# Patient Record
Sex: Male | Born: 1990 | Race: White | Hispanic: No | Marital: Single | Smoking: Never smoker
Health system: Southern US, Community
[De-identification: ages and names within clinical notes are randomized; demographics above are authoritative.]

---

## 2011-11-20 ENCOUNTER — Ambulatory Visit (INDEPENDENT_AMBULATORY_CARE_PROVIDER_SITE_OTHER): Payer: Medicare HMO

## 2011-11-20 DIAGNOSIS — Z130 Encounter for screening for diseases of the blood and blood-forming organs and certain disorders involving the immune mechanism: Secondary | ICD-10-CM

## 2012-05-11 ENCOUNTER — Ambulatory Visit (INDEPENDENT_AMBULATORY_CARE_PROVIDER_SITE_OTHER): Payer: Medicare HMO | Admitting: Physician Assistant

## 2012-05-11 VITALS — BP 112/78 | HR 92 | Temp 97.6°F | Resp 16 | Ht 71.25 in | Wt 195.2 lb

## 2012-05-11 DIAGNOSIS — L0591 Pilonidal cyst without abscess: Secondary | ICD-10-CM

## 2012-05-11 DIAGNOSIS — M533 Sacrococcygeal disorders, not elsewhere classified: Secondary | ICD-10-CM

## 2012-05-11 MED ORDER — AMOXICILLIN-POT CLAVULANATE 875-125 MG PO TABS: 1.0000 | ORAL_TABLET | Freq: Two times a day (BID) | ORAL | Status: AC

## 2012-05-11 NOTE — Progress Notes (Signed)
Patient ID: Mark Ruiz MRN: [id number], DOB: April 07, 1991, 21 y.o. Date of Encounter: 05/11/2012, 9:53 AM  Primary Physician: No primary provider on file.  Chief Complaint: Cyst on tailbone  HPI: 21 y.o. year old male with history below presents with one month history of slowly increasing pain along his tailbone. Patient noticed a cyst was developing there. His pain is "not that bad," unless he applies a moderate amount of pressure to the area. The previous day it began to drain some pus. He has never had anything like this before. Has remained afebrile through out. No nausea or vomiting.    No past medical history on file.   Home Meds: Prior to Admission medications   Medication Sig Start Date End Date Taking? Authorizing Provider  amphetamine-dextroamphetamine (ADDERALL XR) 20 MG 24 hr capsule Take 20 mg by mouth every morning. 2 tablet PO 1x daily   Yes Historical Provider, MD  atomoxetine (STRATTERA) 80 MG capsule Take 80 mg by mouth daily.   Yes Historical Provider, MD    Allergies: No Known Allergies  History   Social History  . Marital Status: Single    Spouse Name: N/A    Number of Children: N/A  . Years of Education: N/A   Occupational History  . Not on file.   Social History Main Topics  . Smoking status: Never Smoker   . Smokeless tobacco: Not on file  . Alcohol Use: Not on file  . Drug Use: Not on file  . Sexually Active: Not on file   Other Topics Concern  . Not on file   Social History Narrative  . No narrative on file     Review of Systems: Constitutional: negative for chills, fever, night sweats, weight changes, or fatigue  HEENT: negative for vision changes, or hearing loss Cardiovascular: negative for chest pain or palpitations Respiratory: negative for hemoptysis, wheezing, shortness of breath, or cough Abdominal: negative for abdominal pain, nausea, vomiting, diarrhea, or constipation Dermatological: negative for rash Neurologic: negative  for headache, dizziness, or syncope All other systems reviewed and are otherwise negative with the exception to those above and in the HPI.   Physical Exam: Blood pressure 112/78, pulse 92, temperature 97.6 F (36.4 C), temperature source Oral, resp. rate 16, height 5' 11.25" (1.81 m), weight 195 lb 3.2 oz (88.542 kg), SpO2 99.00%., Body mass index is 27.03 kg/(m^2). General: Well developed, well nourished, in no acute distress. Head: Normocephalic, atraumatic, eyes without discharge, sclera non-icteric, nares are without discharge.  Neck: Supple. No thyromegaly. Full ROM. No lymphadenopathy. Lungs: Clear bilaterally to auscultation without wheezes, rales, or rhonchi. Breathing is unlabored. Heart: RRR with S1 S2. No murmurs, rubs, or gallops appreciated. Msk:  Strength and tone normal for age. Extremities/Skin: Left side of gluteal cleft with small tender pilonidal cyst. Scant erythema locally. Mild induration, with small amount of central fluctuance.  Neuro: Alert and oriented X 3. Moves all extremities spontaneously. Gait is normal. CNII-XII grossly in tact. Psych:  Responds to questions appropriately with a normal affect.     PROCEDURE NOTE: Verbal consent obtained. Betadine prep per usual protocol. Local anesthesia obtained with 1% plain lidocaine 2 cc. 1 cm incision made with 11 blade along lesion.  Culture taken. Small amount of purulence expressed. Lesion explored revealing no loculations. Irrigated with 1% plain lidocaine Packed with small amount of 1/4 inch plain packing splint into Dressed. Wound care instructions including precautions with patient. Patient tolerated the procedure well.      ASSESSMENT  AND PLAN:  21 y.o. year old male with pilonidal cyst s/p I&D today. -I&D per above -Augmentin 875/125 mg 1 po bid #20 no RF -Warm compresses -Daily dressing changes -Recheck 24 hours, secondary to patient being out of town in 48 hours  Signed, Eula Listen,  PA-C 05/11/2012 9:53 AM

## 2012-05-12 ENCOUNTER — Encounter: Payer: Self-pay | Admitting: Physician Assistant

## 2012-05-12 ENCOUNTER — Ambulatory Visit (INDEPENDENT_AMBULATORY_CARE_PROVIDER_SITE_OTHER): Payer: Medicare HMO | Admitting: Physician Assistant

## 2012-05-12 VITALS — BP 124/86 | HR 81 | Temp 97.8°F | Resp 18 | Ht 71.0 in | Wt 195.0 lb

## 2012-05-12 DIAGNOSIS — L0591 Pilonidal cyst without abscess: Secondary | ICD-10-CM

## 2012-05-12 NOTE — Progress Notes (Signed)
  Subjective:    Patient ID: Mark Ruiz, male    DOB: 1991/04/26, 21 y.o.   MRN: [id number]  HPI Pt presents for recheck of I&D pilonidal yesterday.  Improved over night.  He has not changed the drsg.  He tolerated the ABX ok.   Review of Systems     Objective:   Physical Exam  The drsg was removed and no purulence was expressed from wound.  The packing was removed and the wound was so small that it was not repacked.  Abx ointment and bandaid was placed on the incision site.      Assessment & Plan:  Pt to continue warm soaks and abx.  He is to apply abx ointment to prevent scab formation and allow any remaining purulent to spontaneously express.  He is to f/u if any problems, if no problems no f/u is needed.

## 2012-05-13 LAB — WOUND CULTURE: Gram Stain: NONE SEEN

## 2014-01-05 ENCOUNTER — Emergency Department (HOSPITAL_COMMUNITY): Payer: Managed Care, Other (non HMO)

## 2014-01-05 ENCOUNTER — Emergency Department (HOSPITAL_COMMUNITY)
Admission: EM | Admit: 2014-01-05 | Discharge: 2014-01-05 | Disposition: A | Discharge status: Home / Self Care | Payer: Managed Care, Other (non HMO) | Attending: Emergency Medicine | Admitting: Emergency Medicine

## 2014-01-05 ENCOUNTER — Encounter (HOSPITAL_COMMUNITY): Payer: Self-pay | Admitting: Emergency Medicine

## 2014-01-05 DIAGNOSIS — W010XXA Fall on same level from slipping, tripping and stumbling without subsequent striking against object, initial encounter: Secondary | ICD-10-CM | POA: Insufficient documentation

## 2014-01-05 DIAGNOSIS — Y939 Activity, unspecified: Secondary | ICD-10-CM | POA: Insufficient documentation

## 2014-01-05 DIAGNOSIS — S52109A Unspecified fracture of upper end of unspecified radius, initial encounter for closed fracture: Secondary | ICD-10-CM | POA: Insufficient documentation

## 2014-01-05 DIAGNOSIS — Y929 Unspecified place or not applicable: Secondary | ICD-10-CM | POA: Insufficient documentation

## 2014-01-05 DIAGNOSIS — S52101A Unspecified fracture of upper end of right radius, initial encounter for closed fracture: Secondary | ICD-10-CM

## 2014-01-05 DIAGNOSIS — Z79899 Other long term (current) drug therapy: Secondary | ICD-10-CM | POA: Insufficient documentation

## 2014-01-05 MED ORDER — HYDROCODONE-ACETAMINOPHEN 5-325 MG PO TABS: 1.0000 | ORAL_TABLET | Freq: Four times a day (QID) | ORAL | Status: AC | PRN

## 2014-01-05 NOTE — ED Notes (Signed)
Bed: WA05 Expected date:  Expected time:  Means of arrival:  Comments: 

## 2014-01-05 NOTE — Discharge Instructions (Signed)
Forearm Fracture °Your caregiver has diagnosed you as having a broken bone (fracture) of the forearm. This is the part of your arm between the elbow and your wrist. Your forearm is made up of two bones. These are the radius and ulna. A fracture is a break in one or both bones. A cast or splint is used to protect and keep your injured bone from moving. The cast or splint will be on generally for about 5 to 6 weeks, with individual variations. °HOME CARE INSTRUCTIONS  °· Keep the injured part elevated while sitting or lying down. Keeping the injury above the level of your heart (the center of the chest). This will decrease swelling and pain. °· Apply ice to the injury for 15-20 minutes, 03-04 times per day while awake, for 2 days. Put the ice in a plastic bag and place a thin towel between the bag of ice and your cast or splint. °· If you have a plaster or fiberglass cast: °· Do not try to scratch the skin under the cast using sharp or pointed objects. °· Check the skin around the cast every day. You may put lotion on any red or sore areas. °· Keep your cast dry and clean. °· If you have a plaster splint: °· Wear the splint as directed. °· You may loosen the elastic around the splint if your fingers become numb, tingle, or turn cold or blue. °· Do not put pressure on any part of your cast or splint. It may break. Rest your cast only on a pillow the first 24 hours until it is fully hardened. °· Your cast or splint can be protected during bathing with a plastic bag. Do not lower the cast or splint into water. °· Only take over-the-counter or prescription medicines for pain, discomfort, or fever as directed by your caregiver. °SEEK IMMEDIATE MEDICAL CARE IF:  °· Your cast gets damaged or breaks. °· You have more severe pain or swelling than you did before the cast. °· Your skin or nails below the injury turn blue or gray, or feel cold or numb. °· There is a bad smell or new stains and/or pus like (purulent) drainage  coming from under the cast. °MAKE SURE YOU:  °· Understand these instructions. °· Will watch your condition. °· Will get help right away if you are not doing well or get worse. °Document Released: 10/18/2000 Document Revised: 01/13/2012 Document Reviewed: 06/09/2008 °[employer]® Patient Information ©2014 [employer]. ° °

## 2014-01-05 NOTE — ED Notes (Signed)
Patient states that he fell and "caught my fall on my left arm." Patient states that he believes his left arm is "broken, because of how much I was sweating after it happened." No obvious deformity noted Patient appears in NAD

## 2014-01-05 NOTE — ED Provider Notes (Addendum)
CSN: [id number]     Arrival date & time 01/05/14  0551 History   First MD Initiated Contact with Patient 01/05/14 [phone]     Chief Complaint  Patient presents with  . Arm Pain  . Fall     (Consider location/radiation/quality/duration/timing/severity/associated sxs/prior Treatment) Patient is a 23 y.o. male presenting with arm pain and fall. The history is provided by the patient.  Arm Pain This is a new (tripped over a dog and fell on outstretched arm) problem. The current episode started yesterday. The problem occurs constantly. The problem has not changed since onset.Associated symptoms comments: Pain in the upper forearm/elbow.  No numbness, weakness.  Did not hit head or LOC. The symptoms are aggravated by bending. Nothing relieves the symptoms. Treatments tried: NSAID, elevation, Ice. The treatment provided no relief.  Fall    History reviewed. No pertinent past medical history. History reviewed. No pertinent past surgical history. History reviewed. No pertinent family history. History  Substance Use Topics  . Smoking status: Never Smoker   . Smokeless tobacco: Not on file  . Alcohol Use: No    Review of Systems  All other systems reviewed and are negative.      Allergies  Review of patient's allergies indicates no known allergies.  Home Medications   Current Outpatient Rx  Name  Route  Sig  Dispense  Refill  . amphetamine-dextroamphetamine (ADDERALL XR) 20 MG 24 hr capsule   Oral   Take 40 mg by mouth every morning.          Marland Kitchen. atomoxetine (STRATTERA) 80 MG capsule   Oral   Take 80 mg by mouth daily.         Marland Kitchen. ibuprofen (ADVIL,MOTRIN) 200 MG tablet   Oral   Take 600 mg by mouth every 6 (six) hours as needed.         Marland Kitchen. oxymetazoline (AFRIN) 0.05 % nasal spray   Each Nare   Place 1 spray into both nostrils 2 (two) times daily as needed for congestion.          BP 125/76  Pulse 74  Temp(Src) 98 F (36.7 C) (Oral)  Resp 20  Ht 6' (1.829 m)  Wt  220 lb (99.791 kg)  BMI 29.83 kg/m2  SpO2 96% Physical Exam  Nursing note and vitals reviewed. Constitutional: He is oriented to person, place, and time. He appears well-developed and well-nourished. No distress.  HENT:  Head: Normocephalic and atraumatic.  Cardiovascular: Normal rate.   Pulmonary/Chest: Effort normal.  Musculoskeletal:  Tenderness and swelling over the left proximal radius and medial epicondyle.  Pain with extension of the elbow.  No olecranon or lateral pain.  2+ radial pulse and normal hand grip and sensation.  Left Wrist and shoulder are wnl.  Neurological: He is alert and oriented to person, place, and time. He has normal strength. No sensory deficit.  Skin: Skin is warm and dry.  Psychiatric: He has a normal mood and affect. His behavior is normal.    ED Course  Procedures (including critical care time) Labs Review Labs Reviewed - No data to display Imaging Review Dg Elbow Complete Left  01/05/2014   CLINICAL DATA:  Pain post trauma  EXAM: LEFT ELBOW - COMPLETE 3+ VIEW  COMPARISON:  None.  FINDINGS: Frontal, lateral, and bilateral oblique views were obtained. There is an impacted fracture of the proximal radial metaphysis. Alignment is near anatomic. No other fracture. No dislocation. There is a small joint effusion.  IMPRESSION: Impacted  fracture proximal radial metaphysis.   Electronically Signed   By: Bretta Bang M.D.   On: 01/05/2014 07:39   Dg Forearm Left  01/05/2014   CLINICAL DATA:  Pain post trauma  EXAM: LEFT FOREARM - 2 VIEW  COMPARISON:  None.  FINDINGS: Frontal and lateral views were obtained. There is an impacted fracture of the proximal radial metaphysis in near anatomic alignment. No other fracture. No dislocation. Joint spaces appear intact.  IMPRESSION: Impacted fracture proximal radial metaphysis.   Electronically Signed   By: Bretta Bang M.D.   On: 01/05/2014 07:40     EKG Interpretation None      MDM   Final diagnoses:  Closed  fracture of right proximal radius   Patient presents after a mechanical fall on an outstretched arm last night with persistent left proximal forearm and elbow pain. Patient is neurovascularly intact however some swelling and pain with extension of the elbow. Proximal forearm and elbow films pending.  7:48 AM Plain films show an impacted proximal radial fracture. Patient placed in a long-arm splint and given followup with orthopedics  Gwyneth Sprout, MD 01/05/14 [id number]  Gwyneth Sprout, MD 01/05/14 1191

## 2015-06-01 IMAGING — CR DG FOREARM 2V*L*
2 series · 2 of 2 positions shown · non-contrast
Comparison: None.

CLINICAL DATA: Pain post trauma

EXAM:
LEFT FOREARM - 2 VIEW

[x forearm ap left]
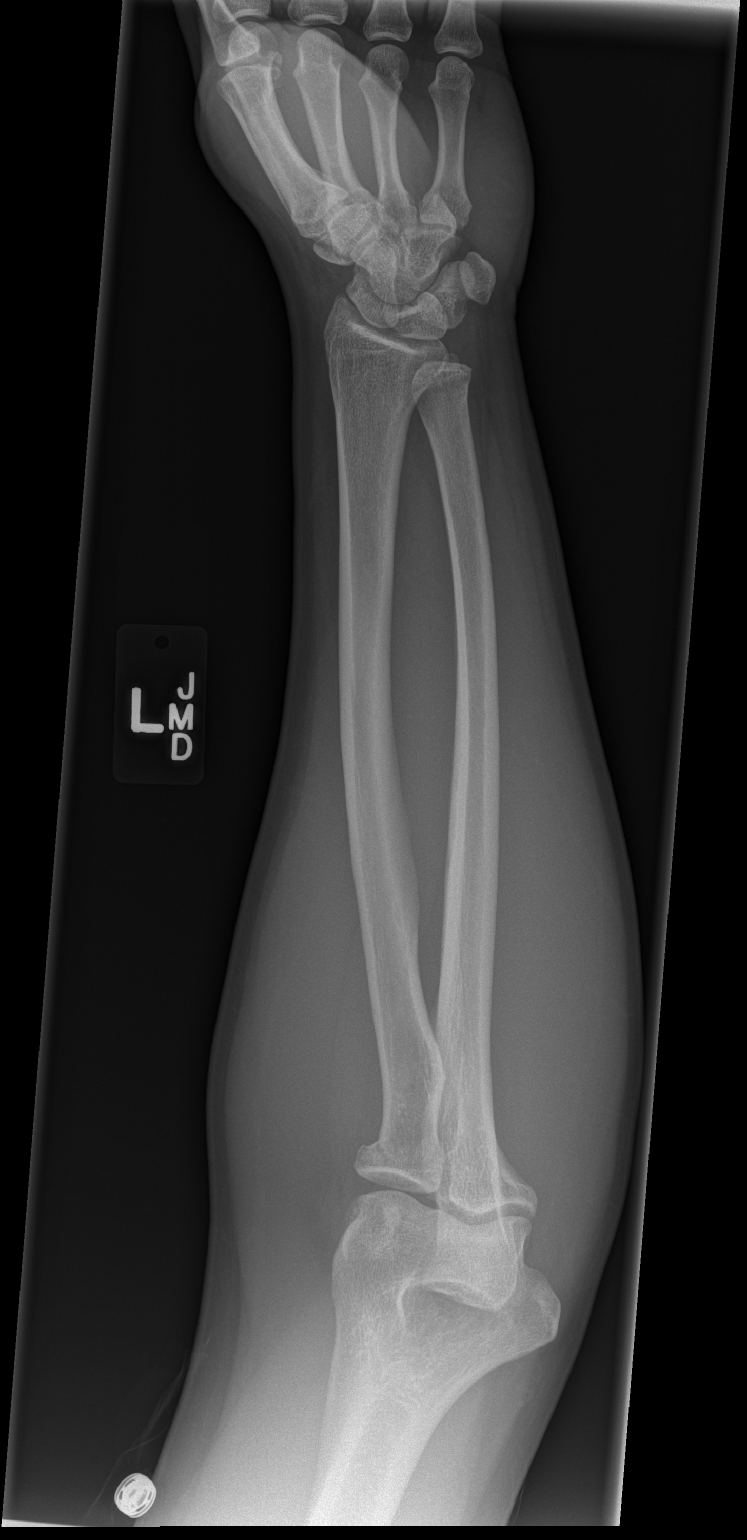

[x forearm lat left]
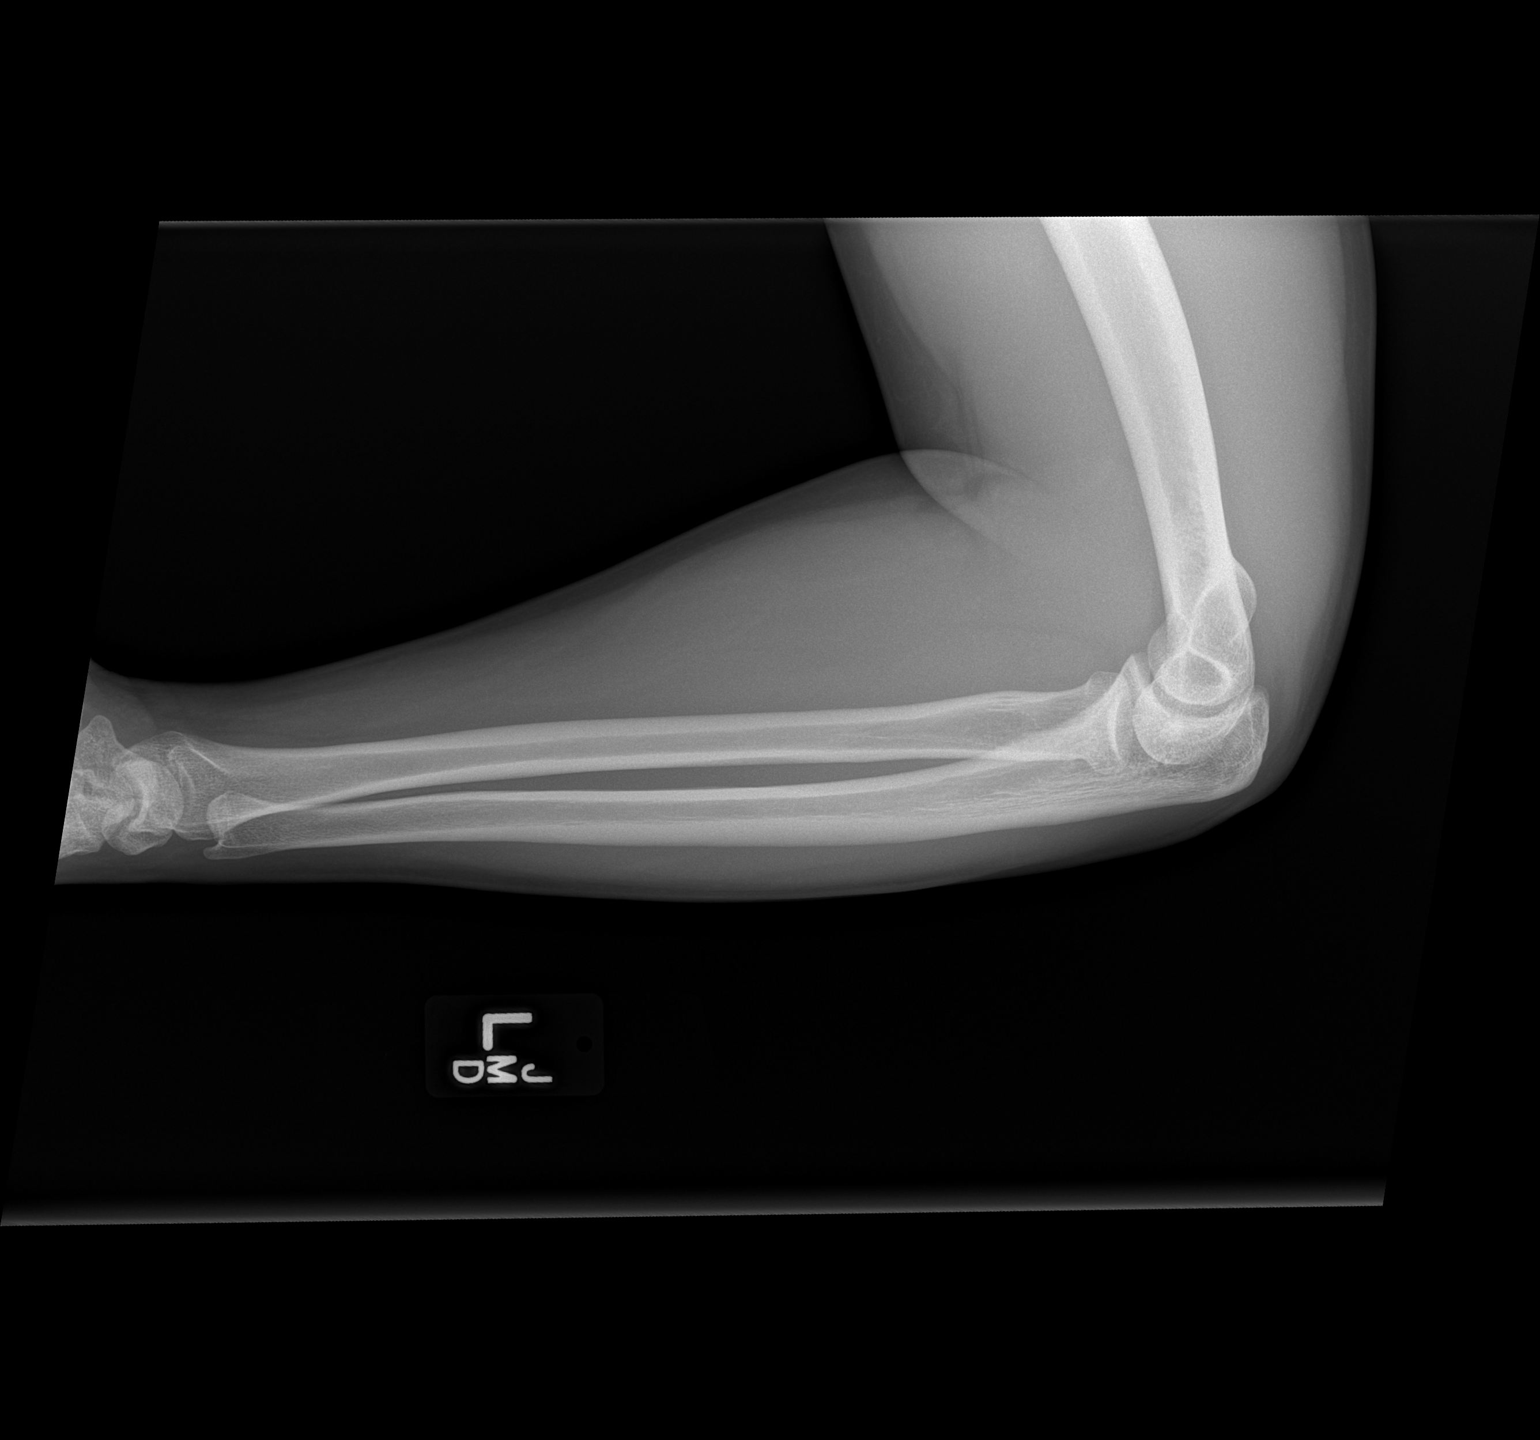

[2 of 2 positions shown; findings below may reference images not displayed]

FINDINGS: Frontal and lateral views were obtained. There is an impacted
fracture of the proximal radial metaphysis in near anatomic
alignment. No other fracture. No dislocation. Joint spaces appear
intact.
IMPRESSION: Impacted fracture proximal radial metaphysis.

## 2015-06-01 IMAGING — CR DG ELBOW COMPLETE 3+V*L*
4 series · 4 of 4 positions shown · non-contrast
Comparison: None.

CLINICAL DATA: Pain post trauma

EXAM:
LEFT ELBOW - COMPLETE 3+ VIEW

[x elbow lat left]
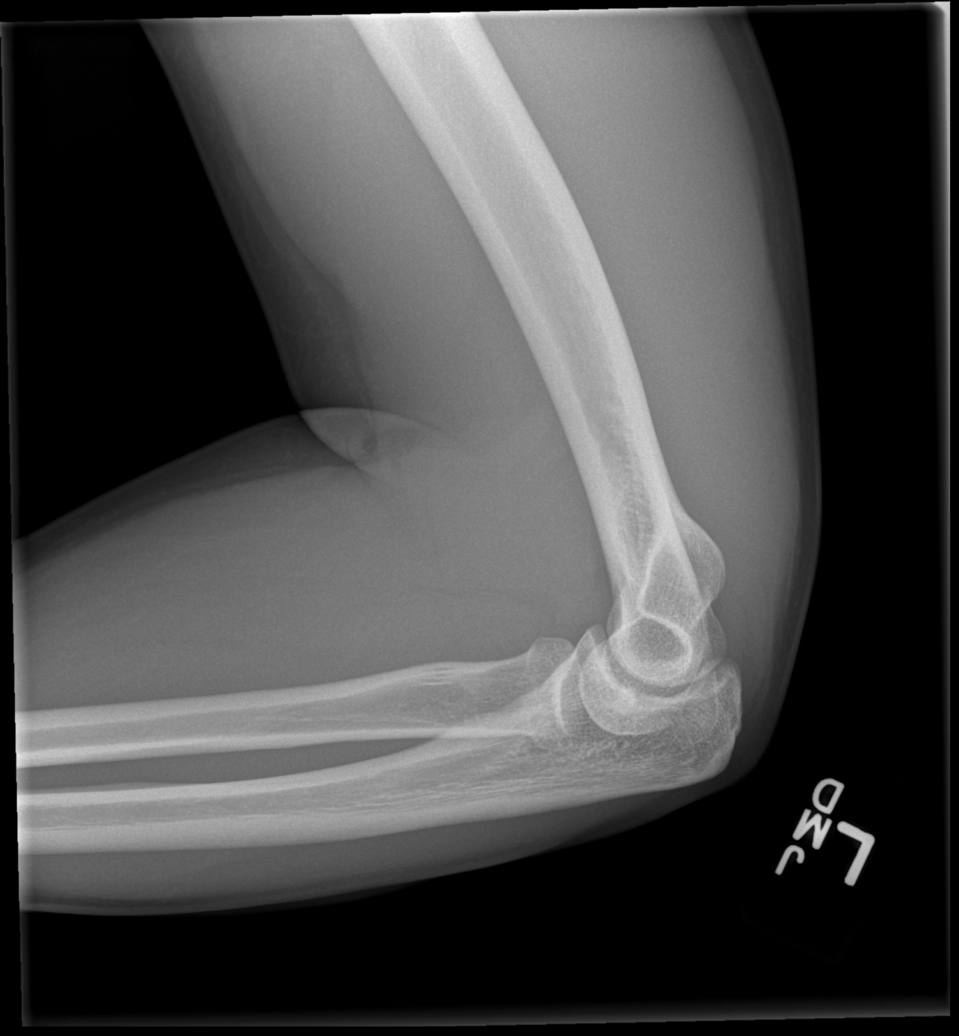

[x elbow ap left]
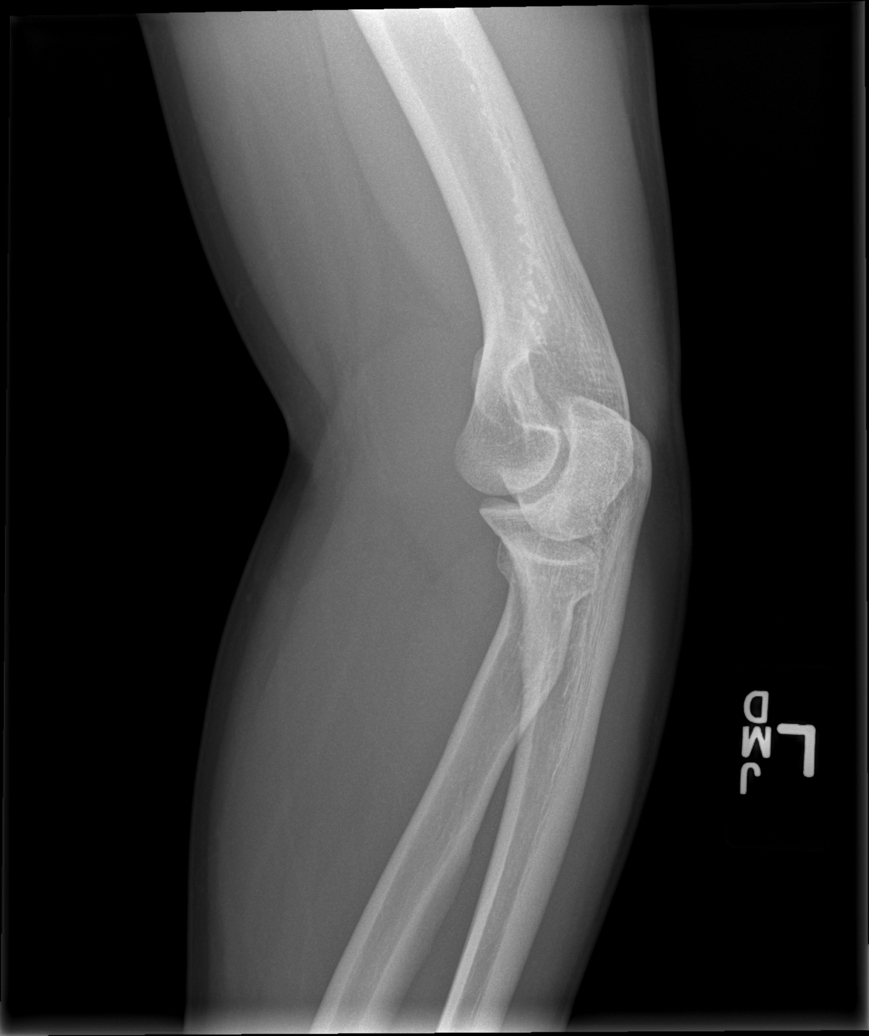

[x elbow obl left (1 of 2)]
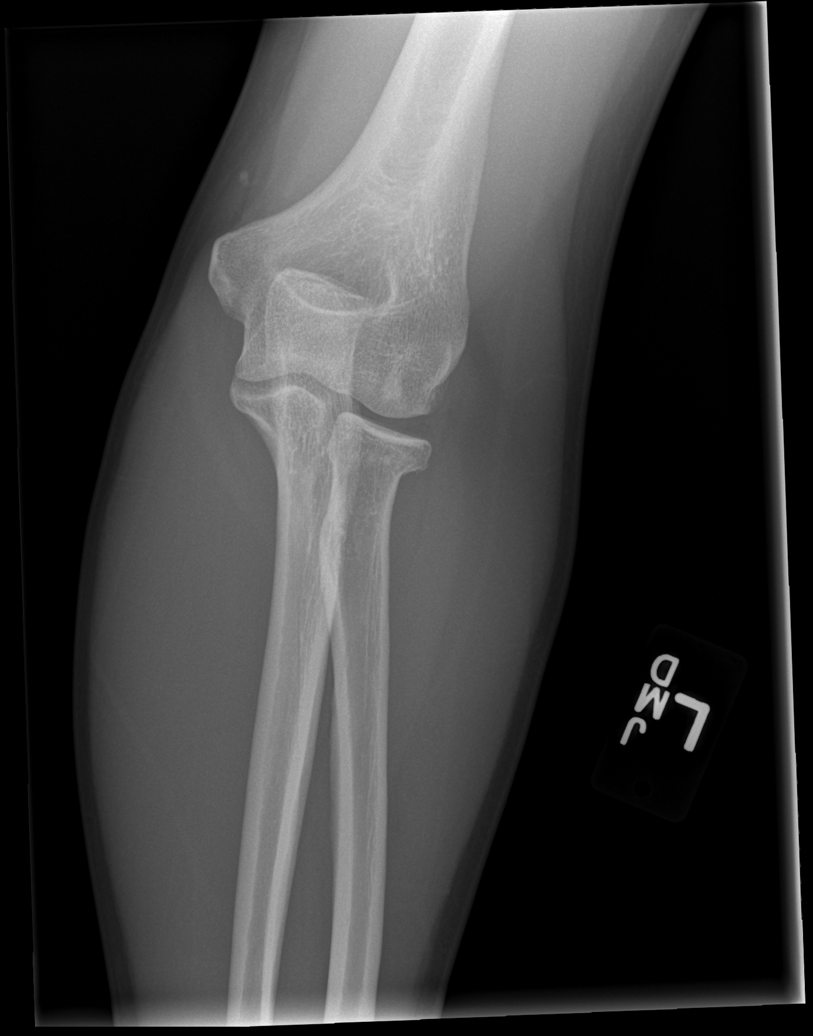

[x elbow obl left (2 of 2)]
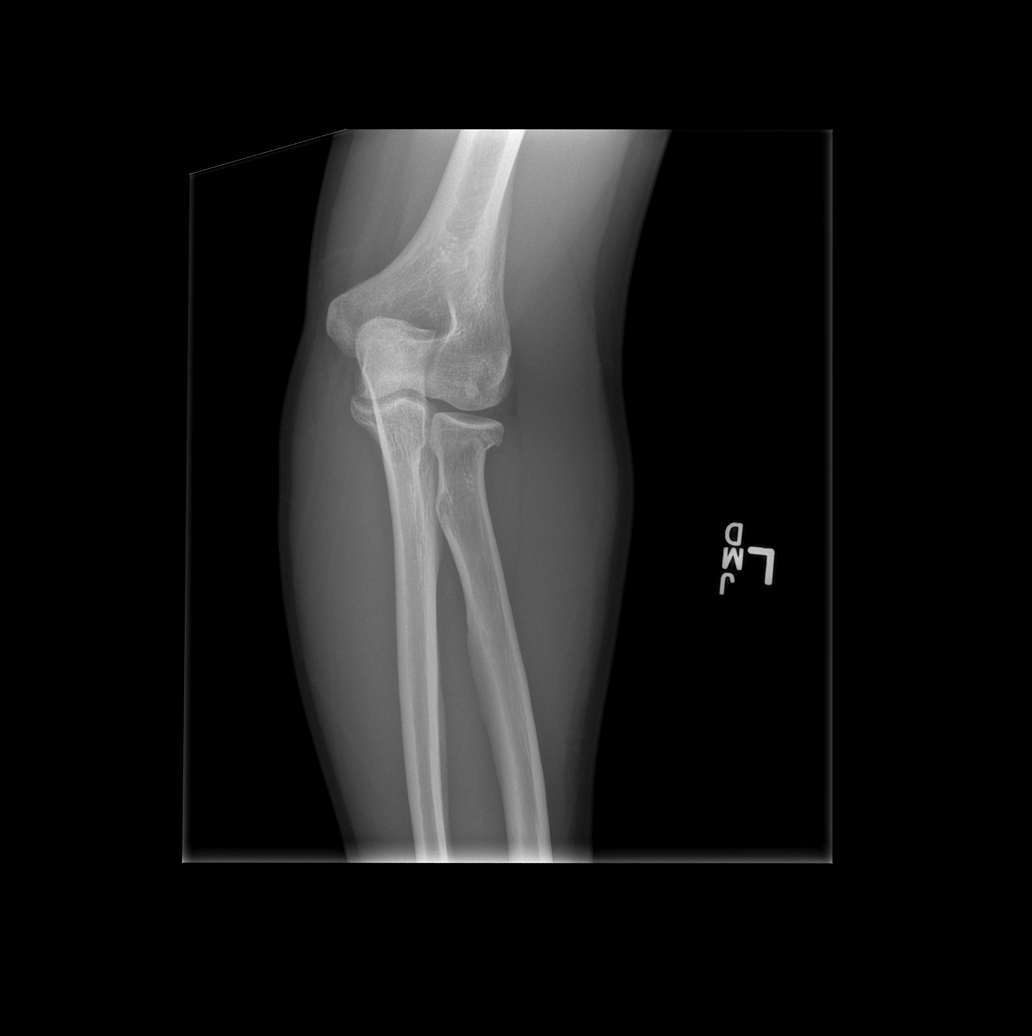

[4 of 4 positions shown; findings below may reference images not displayed]

FINDINGS: Frontal, lateral, and bilateral oblique views were obtained. There
is an impacted fracture of the proximal radial metaphysis. Alignment
is near anatomic. No other fracture. No dislocation. There is a
small joint effusion.
IMPRESSION: Impacted fracture proximal radial metaphysis.

## 2022-07-05 ENCOUNTER — Encounter: Admit: 2022-07-05 | Discharge: 2022-07-05 | Payer: No Typology Code available for payment source

## 2022-07-17 ENCOUNTER — Encounter: Admit: 2022-07-17 | Discharge: 2022-07-17 | Payer: No Typology Code available for payment source

## 2022-07-17 ENCOUNTER — Ambulatory Visit: Admit: 2022-07-17 | Discharge: 2022-07-18 | Payer: 59

## 2022-07-17 DIAGNOSIS — Z114 Encounter for screening for human immunodeficiency virus [HIV]: Secondary | ICD-10-CM

## 2022-07-17 DIAGNOSIS — F419 Anxiety disorder, unspecified: Secondary | ICD-10-CM

## 2022-07-17 DIAGNOSIS — F5104 Psychophysiologic insomnia: Secondary | ICD-10-CM

## 2022-07-17 DIAGNOSIS — T7840XA Allergy, unspecified, initial encounter: Secondary | ICD-10-CM

## 2022-07-17 DIAGNOSIS — Z0000 Encounter for general adult medical examination without abnormal findings: Secondary | ICD-10-CM

## 2022-07-17 MED ORDER — VENLAFAXINE 37.5 MG PO CP24
ORAL_CAPSULE | ORAL | 0 refills | Status: AC
Start: 2022-07-17 — End: ?

## 2022-07-17 NOTE — Progress Notes
Subjective:       Patient Reported Other  What topic(s) would you like to cover during your appointment?:  Medications  Establishing care  Please describe the issue(s) and history with the issue (location, severity, duration, symptoms, etc.).:  Need for continued care for my ADHD. I have been treated for ADHD since the late 90's.  What has been done so far to take care of the issue(s)?:  Medication, coaching, therapy  What are your goals for this visit?:  Establish care.    Frank Parrish is a 31 y.o. male who presents for comprehensive visit to establish care. He has medical history of ADHD, anxiety and depression (not currently on any medication), OSA, obesity and elevated liver enzymes.     His wife is a transplant hepatology attending at [employer]. They recently moved here from [place].   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   He is scheduled to see a psychiatrist next month outside the [facility] system - Dr. Nolen Mu at [address] In [place], [place]. He reports in the past he has taken medications - Lexapro and he is unsure of the name of the other, but he thinks it may have been an SNRI. if he has taken an SNRI. He reports they have caused him difficulties with sleep.   He awakens in the middle of the night and feels tense. He is interested in starting a medication to help with mood prior to establishing with psychiatry. We reviewed adverse effects of SSRIs and SNRIs and he is agreeable to trial of venlafaxine.   ADHD- Adderall 30 mg ER in the AM with the dextrostat 10 mg x BID - This was previously managed by his psychiatrist, Dr. Briscoe Burns. Previously was on higher doses of ADHD medications. He plans to have his psychiatrist take over filling these medications.       Gabapentin 300 mg qHS initially started for nerve pain - taking it now more for sleep. Uses clonidine for sleep as well. He is interested in referral to [facility] to discuss CBT for insomnia.   Reports he had a sleep study in 2019 when he was 30 lbs heavier. Diagnosed with OSA; he hasn't been able to tolerate a CPAP mask. He would like referral for sleep evaluation.    He used Noom to lose weight - has been using it for 2 years. He focuses on mindful eating. He does Crossfit and running for exercise. He runs 3 to 6 miles two or three times per week.     He is a nonsmoker.  Stopped drinking all alcohol about 4 years ago.   No concern for STIs.        Review of Systems   Psychiatric/Behavioral: Positive for dysphoric mood and sleep disturbance. Negative for suicidal ideas. The patient is nervous/anxious.        Medical History:   Diagnosis Date   ? Allergy 1992    seasonal allergies   ? Anxiety disorder 2017     Surgical History:   Procedure Laterality Date   ? ANKLE SURGERY Left     2018   ? HX TONSILLECTOMY  2012   ? SPINE SURGERY  2013    microdiscectomy     Family History   Problem Relation Age of Onset   ? Mental Illness Mother         ADHD, Anxiety   ? Hypertension Father    ? Mental  Illness Father         ADHD, Anxiety   ? Mental Illness Sister         ADHD, Anxiety, Depression   ? Mental Illness Paternal Uncle         ADHD   ? Mental Illness Paternal Aunt         ADHD     Social History     Socioeconomic History   ? Marital status: Married   Tobacco Use   ? Smoking status: Never   ? Smokeless tobacco: Never   Substance and Sexual Activity   ? Alcohol use: Never   ? Drug use: Never   ? Sexual activity: Yes     Partners: Female     Birth control/protection: Pill                   Depression Screening:  Patient Scores:  : PHQ-2 Score: 4 (07/17/2022 11:18 AM)    Patient Health Questionnaire (PHQ)-9: PHQ-9 Score: 15 (07/17/2022 11:18 AM)    Interventions:  Patient Health Questionnaire (PHQ)-2: No data recorded  Depression Interventions Patient Health Questionnaire (PHQ)-2/9: Interventions: -- (Prescribed venlafaxine; will establish with Psychiatry in 1 month) (07/17/2022 11:18 AM)          Objective:         ? amphetamine-dextroamphetamine XR (ADDERALL XR) 30 mg capsule Take one capsule by mouth every morning.   ? cetirizine (ZYRTEC) 10 mg tablet Take one tablet by mouth daily.   ? cloNIDine HCL (CATAPRES) 0.3 mg tablet Take one tablet by mouth daily.   ? dextroamphetamine sulfate (DEXTROSTAT) 10 mg tablet Take one tablet by mouth twice daily.   ? gabapentin (NEURONTIN) 300 mg capsule Take three capsules by mouth at bedtime daily.   ? venlafaxine XR (EFFEXOR XR) 37.5 mg capsule Take one capsule by mouth daily for 14 days, THEN two capsules daily for 42 days. Indications: anxiousness associated with depression     Vitals:    07/17/22 1056   BP: 131/83   Pulse: 68   Resp: 16   SpO2: 98%   PainSc: Zero   Weight: 89.9 kg (198 lb 4.8 oz)   Height: 182.9 cm (6')     Body mass index is 26.89 kg/m?Marland Kitchen     Reviewed his labs from 10/2021:   HbA1c: 5.4%  CBC unremarkable.   Lipid profile notable for LDL 128, HDL 49, triglycerides 80; improved from prior in 09/2018  CMP wnl - AST: 26, ALT 39, Alk phos: 64. Prior abnormal liver enzymes.    Physical Exam  Vitals reviewed.   Constitutional:       General: He is not in acute distress.     Appearance: Normal appearance. He is not ill-appearing.   HENT:      Head: Normocephalic and atraumatic.      Right Ear: Tympanic membrane, ear canal and external ear normal.      Left Ear: Tympanic membrane, ear canal and external ear normal.   Eyes:      Extraocular Movements: Extraocular movements intact.   Cardiovascular:      Rate and Rhythm: Normal rate and regular rhythm.      Heart sounds: Normal heart sounds. No murmur heard.  Pulmonary:      Effort: Pulmonary effort is normal. No respiratory distress.      Breath sounds: Normal breath sounds.   Abdominal:      Palpations: Abdomen is soft.      Tenderness: There  is no abdominal tenderness.   Musculoskeletal:      Cervical back: No tenderness.      Right lower leg: No edema.      Left lower leg: No edema. Lymphadenopathy:      Cervical: No cervical adenopathy.   Skin:     General: Skin is warm and dry.   Neurological:      General: No focal deficit present.      Mental Status: He is alert and oriented to person, place, and time.   Psychiatric:         Mood and Affect: Mood normal.         Behavior: Behavior normal.              Assessment and Plan:  Maury Groninger was seen today for establish care.    Diagnoses and all orders for this visit:    Encounter for screening and preventative care  Discussed age and gender appopriate screening and preventative care. Provided counseling and anticipatory guidance as well as risk factor reduction interventions.   Encuraged ongoing healthy lifestyle    Screening for HIV (human immunodeficiency virus)  -     HIV 1 & 2 AG-AB SCRN W REFLEX TO HIV CONFIRMATION; Future; Expected date: 07/17/2022      Psychophysiological insomnia  - Likely due to multiple factors: medication, anxiety/depressiona and OSA. Will start venlafaxine as below, refer to [facility] for CBT-i and also address OSA.   -     AMB REFERRAL TO [facility]    OSA (obstructive sleep apnea)  Prior diagnosis in 2019 when 30 lbs heavier; continues to focus on weight loss. He has not been able to tolerate CPAP mask.   -     AMB REFERRAL TO PULMONARY    Overweight  Body mass index is 26.89 kg/m?Marland Kitchen     INTRACLINIC REFERRAL TO DIETICIAN    Anxiety and depression  -  Will start venlafaxine 37.5 mg daily x 14 days and then increase to 75 mg and follow up with Psychiatry. Advised if not able to tolerate this dose or has any adverse side effects, let us know otherwise will defer follow up to Psychiatry who he will establish with in 1 month.   -     venlafaxine XR (EFFEXOR XR) 37.5 mg capsule; Take one capsule by mouth daily for 14 days, THEN two capsules daily for 42 days. Indications: anxiousness associated with depression        Above medical problems reviewed and discussed in detail.       Lorrene Reid, MD      Heath Maintenance / Prevention    Health Maintenance   Topic Date Due   ? HIV SCREENING  Never done   ? INFLUENZA VACCINE (1) 08/04/2022   ? PHYSICAL (COMPREHENSIVE) EXAM  07/18/2023   ? DTAP/TDAP VACCINES (2 - Td or Tdap) 02/05/2025   ? COVID-19 VACCINE  Completed   ? DEPRESSION SCREENING  Completed   ? HEPATITIS C SCREENING  Addressed   ? PNEUMOCOCCAL VACCINE 0-64 YRS  Aged Out         Immunization History   Administered Date(s) Administered   ? COVID-19 (MODERNA BOOSTER), mRNA vacc, 50 mcg/0.25 mL (PF) 09/25/2020   ? COVID-19 (MODERNA), mRNA vacc, 100 mcg/0.5 mL (PF) 02/02/2020, 03/01/2020   ? COVID-19 Bivalent (5YR+)(PFIZER), mRNA vacc, 41mcg/0.3mL 08/20/2021   ? Flu Vaccine Quadrivalent =>3 Yo (Preservative Free) 11/13/2014, 09/10/2018, 09/17/2019, 09/25/2020, 09/07/2021   ?  Flu Vaccine Trivalent =>3 YO 11/07/2014   ? Flu Vaccine Trivalent =>3 Yo (Preservative Free) 11/13/2012   ? Tdap Vaccine 02/06/2015          Orders Placed This Encounter   ? HIV 1 & 2 AG-AB SCRN W REFLEX TO HIV CONFIRMATION   ? [facility]   ? AMB REFERRAL TO PULMONARY   ? INTRACLINIC REFERRAL TO DIETICIAN   ? venlafaxine XR (EFFEXOR XR) 37.5 mg capsule       Patient Instructions     It was great seeing you today!     Orders Placed This Encounter   ? HIV 1 & 2 AG-AB SCRN W REFLEX TO HIV CONFIRMATION   ? [facility]   ? AMB REFERRAL TO PULMONARY   ? INTRACLINIC REFERRAL TO DIETICIAN   ? venlafaxine XR (EFFEXOR XR) 37.5 mg capsule         I will plan to see you next in 12 months.      How to reach Dr. Malachi Carl:  Please send a MyChart message to the General Medicine clinic or call Dr. Aggie Cosier nurse, Lyla Son, at [phone].  When calling please leave your name (including spelling), date of birth, phone number where you can be reached and a description of why you are calling.  Lyla Son will return your call as soon as possible.  If you need to send a fax to our office, our fax number is [phone].     Scheduling:  The General Medicine clinic scheduling line can be contacted at [phone] option 1.  Same day appointments are usually available for urgent care needs (note, this will be with the first available physician or nurse practitioner if Dr. Malachi Carl has no openings).     Lab Results:  Due to the CARES act, as of April 1st, 2021 lab results automatically release to MyChart.  Dr. Malachi Carl will send you a result note on any labs that she orders.       Please be aware of the [facility] and [facility]ics that may be good options for care when our clinic is not open to you.  Information included below.     [facility]  Located at the [facility]  [address]  [address]  [place], [place] [address]  Phone:  [phone]  [email or web address]  Open:  8:00 AM - 7:00 PM (CDT) Monday-Friday     [facility] [place] (Urgent Care centers are open every holiday except Christmas Day)     [facility]  [address]  Open 7:00 AM - 9:00 PM (CDT)  [phone]     [facility]  [address]  [phone]  Weekdays 9AM-9PM  Weekends 8AM-4PM     [facility]  [facility]  [address]  [phone]  Weekdays 8AM-6:30PM  Saturdays 10AM-4PM     [facility]  [address]  [phone]  Weekdays 5PM-9PM  Weekends 8AM- 1PM

## 2022-07-17 NOTE — Patient Instructions
It was great seeing you today!     Orders Placed This Encounter    HIV 1 & 2 AG-AB SCRN W REFLEX TO HIV CONFIRMATION    [facility]    AMB REFERRAL TO PULMONARY    INTRACLINIC REFERRAL TO DIETICIAN    venlafaxine XR (EFFEXOR XR) 37.5 mg capsule         I will plan to see you next in 12 months.      How to reach Dr. Malachi Carl:  Please send a MyChart message to the General Medicine clinic or call Dr. Aggie Cosier nurse, Lyla Son, at [phone].  When calling please leave your name (including spelling), date of birth, phone number where you can be reached and a description of why you are calling.  Lyla Son will return your call as soon as possible.  If you need to send a fax to our office, our fax number is [phone].     Scheduling:  The General Medicine clinic scheduling line can be contacted at [phone] option 1.  Same day appointments are usually available for urgent care needs (note, this will be with the first available physician or nurse practitioner if Dr. Malachi Carl has no openings).     Lab Results:  Due to the CARES act, as of April 1st, 2021 lab results automatically release to MyChart.  Dr. Malachi Carl will send you a result note on any labs that she orders.       Please be aware of the [facility] and [facility] that may be good options for care when our clinic is not open to you.  Information included below.     [facility]  Located at the [facility]  [address]  [address]  [place], [place] [address]  Phone:  [phone]  [email or web address]  Open:  8:00 AM - 7:00 PM (CDT) Monday-Friday      Urgent Care Centers- [place] (Urgent Care centers are open every holiday except Christmas Day)     [facility]  [facility]  [address]  Open 7:00 AM - 9:00 PM (CDT)  [phone]     [facility]  [address]  [phone]  Weekdays 9AM-9PM  Weekends 8AM-4PM     [facility]  [facility]  [address]  [phone]  Weekdays 8AM-6:30PM  Saturdays 10AM-4PM     [facility]  [address]  [phone]  Weekdays 5PM-9PM  Weekends 8AM- 1PM

## 2022-07-18 DIAGNOSIS — G4733 Obstructive sleep apnea (adult) (pediatric): Secondary | ICD-10-CM

## 2022-07-18 DIAGNOSIS — E663 Overweight: Secondary | ICD-10-CM

## 2022-07-23 ENCOUNTER — Encounter: Admit: 2022-07-23 | Discharge: 2022-07-23 | Payer: No Typology Code available for payment source

## 2022-08-02 ENCOUNTER — Encounter: Admit: 2022-08-02 | Discharge: 2022-08-02 | Payer: 59

## 2022-08-02 MED ORDER — CLONIDINE HCL 0.3 MG PO TAB
.3 mg | ORAL_TABLET | Freq: Every day | ORAL | 3 refills | Status: AC
Start: 2022-08-02 — End: ?

## 2022-08-02 MED ORDER — DEXTROAMPHETAMINE SULFATE 10 MG PO TAB
10 mg | ORAL_TABLET | Freq: Two times a day (BID) | ORAL | 0 refills | Status: CN
Start: 2022-08-02 — End: ?

## 2022-08-02 MED ORDER — DEXTROAMPHETAMINE SULFATE 10 MG PO TAB
10 mg | ORAL_TABLET | Freq: Two times a day (BID) | ORAL | 0 refills | Status: AC
Start: 2022-08-02 — End: ?

## 2022-08-02 MED ORDER — DEXTROAMPHETAMINE-AMPHETAMINE 30 MG PO CP24
30 mg | ORAL_CAPSULE | Freq: Every morning | ORAL | 0 refills | Status: CN
Start: 2022-08-02 — End: ?

## 2022-08-02 MED ORDER — DEXTROAMPHETAMINE-AMPHETAMINE 30 MG PO CP24
30 mg | ORAL_CAPSULE | Freq: Every morning | ORAL | 0 refills | Status: AC
Start: 2022-08-02 — End: ?

## 2022-08-02 NOTE — Telephone Encounter
Requested Prescriptions   Pending Prescriptions Disp Refills   ? cloNIDine HCL (CATAPRES) 0.3 mg tablet 90 tablet 3     Sig: Take one tablet by mouth daily.       Cardiovascular: Alpha-2 Agonists - Clonidine & Guanfacine Passed - 08/02/2022  1:06 PM        Passed - Valid encounter within last 6 months     Recent Visits  Date Type Provider Dept   07/17/22 Office Visit Altenhofen, Rosina Lowenstein, MD [facility]   Showing recent visits within past 720 days and meeting all other requirements  Future Appointments  Date Type Provider Dept   09/05/22 Appointment [facility]   09/19/22 Appointment [facility]   10/03/22 Appointment [facility]   Showing future appointments within next 360 days and meeting all other requirements            Passed - BP completed in the last 6 months     BP Readings from Last 2 Encounters:   07/17/22 131/83             Passed - Heart Rate completed in the last 6 months     Pulse Readings from Last 2 Encounters:   07/17/22 68              ? amphetamine-dextroamphetamine XR (ADDERALL XR) 30 mg capsule 30 capsule 0     Sig: Take one capsule by mouth every morning.       Psychiatry: Stimulants/ADHD Failed - 08/02/2022  1:06 PM        Failed - This refill cannot be delegated        Failed - Manual Review: Consider an annual Urine Drug Screen and Controlled Substance Agreement        Passed - Valid encounter within last 3 months     Recent Visits  Date Type Provider Dept   07/17/22 Office Visit Altenhofen, Rosina Lowenstein, MD [facility]   Showing recent visits within past 720 days and meeting all other requirements  Future Appointments  Date Type Provider Dept   09/05/22 Appointment [facility]   09/19/22 Appointment [facility]   10/03/22 Appointment [facility]   Showing future appointments within next 360 days and meeting all other requirements             ? dextroamphetamine sulfate (DEXTROSTAT) 10 mg tablet 180 tablet 0     Sig: Take one tablet by mouth twice daily.       Psychiatry: Stimulants/ADHD Failed - 08/02/2022  1:06 PM        Failed - This refill cannot be delegated        Failed - Manual Review: Consider an annual Urine Drug Screen and Controlled Substance Agreement        Passed - Valid encounter within last 3 months     Recent Visits  Date Type Provider Dept   07/17/22 Office Visit Altenhofen, Rosina Lowenstein, MD [facility]   Showing recent visits within past 720 days and meeting all other requirements  Future Appointments  Date Type Provider Dept   09/05/22 Appointment [facility]   09/19/22 Appointment [facility]   10/03/22 Appointment [facility]   Showing future appointments within next 360 days and meeting all other requirements

## 2022-08-12 ENCOUNTER — Ambulatory Visit: Admit: 2022-08-12 | Discharge: 2022-08-12 | Payer: 59

## 2022-08-12 ENCOUNTER — Encounter: Admit: 2022-08-12 | Discharge: 2022-08-12 | Payer: 59

## 2022-08-12 DIAGNOSIS — H9209 Otalgia, unspecified ear: Secondary | ICD-10-CM

## 2022-08-12 NOTE — Progress Notes
Patient concerned for possible ear infection. Referred in person urgent care for internal ear exam which is unavailable via telehealth. Verbalized understanding, in NAD

## 2022-08-13 ENCOUNTER — Encounter: Admit: 2022-08-13 | Discharge: 2022-08-13 | Payer: 59

## 2022-09-05 ENCOUNTER — Encounter: Admit: 2022-09-05 | Discharge: 2022-09-05 | Payer: 59

## 2022-09-19 ENCOUNTER — Encounter: Admit: 2022-09-19 | Discharge: 2022-09-19 | Payer: 59

## 2022-10-03 ENCOUNTER — Encounter: Admit: 2022-10-03 | Discharge: 2022-10-03 | Payer: 59

## 2022-10-11 ENCOUNTER — Encounter: Admit: 2022-10-11 | Discharge: 2022-10-11 | Payer: 59

## 2022-10-23 ENCOUNTER — Encounter: Admit: 2022-10-23 | Discharge: 2022-10-23 | Payer: 59

## 2022-10-31 ENCOUNTER — Encounter: Admit: 2022-10-31 | Discharge: 2022-10-31 | Payer: 59

## 2022-11-01 ENCOUNTER — Encounter: Admit: 2022-11-01 | Discharge: 2022-11-01 | Payer: 59

## 2022-11-01 ENCOUNTER — Ambulatory Visit: Admit: 2022-11-01 | Discharge: 2022-11-01 | Payer: 59

## 2022-11-01 DIAGNOSIS — T7840XA Allergy, unspecified, initial encounter: Secondary | ICD-10-CM

## 2022-11-01 DIAGNOSIS — F419 Anxiety disorder, unspecified: Secondary | ICD-10-CM

## 2022-11-01 DIAGNOSIS — M25572 Pain in left ankle and joints of left foot: Secondary | ICD-10-CM

## 2022-11-11 ENCOUNTER — Encounter: Admit: 2022-11-11 | Discharge: 2022-11-11 | Payer: 59

## 2022-11-11 ENCOUNTER — Ambulatory Visit: Admit: 2022-11-11 | Discharge: 2022-11-12 | Payer: 59

## 2022-11-11 DIAGNOSIS — Z8669 Personal history of other diseases of the nervous system and sense organs: Secondary | ICD-10-CM

## 2022-11-11 DIAGNOSIS — R634 Abnormal weight loss: Secondary | ICD-10-CM

## 2022-11-11 DIAGNOSIS — Z789 Other specified health status: Secondary | ICD-10-CM

## 2022-11-11 DIAGNOSIS — F419 Anxiety disorder, unspecified: Secondary | ICD-10-CM

## 2022-11-11 DIAGNOSIS — T7840XA Allergy, unspecified, initial encounter: Secondary | ICD-10-CM

## 2022-11-11 NOTE — Telephone Encounter
An encounter has been created for documentation only (often for preparation of an upcoming appointment or for follow up on orders/imaging or records received) and patient does not need contact RN and did not miss a phone call or appointment.     Pre-visit planning for upcoming appointment with Dr.Luthra    New Pt    Referred By: Otilio Saber, MD   [facility]        -DX:OSA (obstructive sleep apnea)     -Last SS: Reports he had a sleep study in 2019 when he was 30 lbs heavier. Diagnosed with OSA; he hasn't been able to tolerate a CPAP mask. He would like referral for sleep evaluation.     -No records found in [place] or [facility].

## 2022-11-12 DIAGNOSIS — G4733 Obstructive sleep apnea (adult) (pediatric): Secondary | ICD-10-CM

## 2022-12-20 ENCOUNTER — Encounter: Admit: 2022-12-20 | Discharge: 2022-12-20 | Payer: 59

## 2022-12-20 DIAGNOSIS — Z789 Other specified health status: Secondary | ICD-10-CM

## 2022-12-20 DIAGNOSIS — R634 Abnormal weight loss: Secondary | ICD-10-CM

## 2022-12-20 DIAGNOSIS — Z8669 Personal history of other diseases of the nervous system and sense organs: Secondary | ICD-10-CM

## 2022-12-21 ENCOUNTER — Ambulatory Visit: Admit: 2022-12-21 | Discharge: 2022-12-20 | Payer: 59

## 2022-12-25 ENCOUNTER — Encounter: Admit: 2022-12-25 | Discharge: 2022-12-25 | Payer: 59

## 2022-12-25 DIAGNOSIS — R0683 Snoring: Secondary | ICD-10-CM

## 2022-12-26 ENCOUNTER — Encounter: Admit: 2022-12-26 | Discharge: 2022-12-26 | Payer: 59

## 2022-12-26 NOTE — Telephone Encounter
Pt requesting most recent SS results and recommendations.  Routed to provider to advise.

## 2023-01-14 ENCOUNTER — Encounter: Admit: 2023-01-14 | Discharge: 2023-01-14 | Payer: 59

## 2023-01-16 ENCOUNTER — Encounter: Admit: 2023-01-16 | Discharge: 2023-01-16 | Payer: 59

## 2023-01-16 ENCOUNTER — Ambulatory Visit: Admit: 2023-01-16 | Discharge: 2023-01-17 | Payer: 59

## 2023-01-16 DIAGNOSIS — F5104 Psychophysiologic insomnia: Secondary | ICD-10-CM

## 2023-01-16 DIAGNOSIS — F419 Anxiety disorder, unspecified: Secondary | ICD-10-CM

## 2023-01-16 DIAGNOSIS — T7840XA Allergy, unspecified, initial encounter: Secondary | ICD-10-CM

## 2023-01-16 DIAGNOSIS — Z8669 Personal history of other diseases of the nervous system and sense organs: Secondary | ICD-10-CM

## 2023-01-16 MED ORDER — DOXEPIN 10 MG PO CAP
10 mg | ORAL_CAPSULE | Freq: Every evening | ORAL | 0 refills | Status: AC
Start: 2023-01-16 — End: ?

## 2023-01-16 NOTE — Progress Notes
Date of Service: 01/16/2023    Telemedicine encounter was conducted using epic integrated Zoom telehealth interface, patient's consent for telemedicine appointment was obtained, patient was seen and evaluated on the above mentioned audio-video platform.     Altenhofen, Rosina Lowenstein, MD  [address] [facility] 4th floor  [place],  [place] [address]    Dear Dr. Malachi Carl,            Frank Parrish is a very pleasant 32 y.o. male presenting to [facility] Sleep Medicine clinic for ongoing evaluation of Sleep Problem.      Subjective:  History of Present Illness  Sleeping about the same.   History of ADHD, Adderall 30 mg XR in morning, 10 mg IR in morning and occasionally 2:30 pm.   Feeling groggy around 9:30 pm, lying in bed by 10:30 pm, no sleep onset, waking up at night about 2-3 times, bathroom sometimes, melatonin reduced to 5 mg, venlafaxine and clonidine 0.3 mg XR, gabapentin was stopped. Waking up by 6:30 am. No daytime naps.     Feels out of energy around noon and reports that his ESS is likely low secondary to him being on stimulants.     Sleep ROS: snoring, no choking/gasping spells, no dry mouth, no restless legs, no sleep related hallucinations, no dream enactments, no sleep walking/eating, no sleep paralysis.     ESS 01/16/2023:       01/16/2023    12:02 PM 12/18/2022    11:00 AM 11/11/2022    11:27 AM   Epworth Sleepiness Scale   Sitting and reading 2 2 1    Watching TV 2 2 0   Sitting inactive in a public place (e.g. a theater or a meeting) 0 0 0   As a passenger in a car for an hour without a break 0 1 0   Lying down to rest in the afternoon when circumstances permit 2 2 1    Sitting and talking to someone 0 0 0   Sitting quietly after a lunch without alcohol 1 1 0   In a car, while stopped for a few minutes in traffic 0 0 0   Epworth Sleepiness Scale Score 7 8 2            Review of Systems:  Sleep related ROS as above, no other new symptoms reported.     Pre-Visit planning:   FUV     -JY:NWGNFA loss History of sleep apnea     Intolerance of continuous positive airway pressure (CPAP) ventilation     Psychophysiological insomnia     -Last SS: 12/20/22      -LV: 11/11/22 with Dr. Clarene Critchley     -01/14/23- Pt reports 'started taking my antidepressants in September and the issue of waking up through out the night was present before that. I knew my sleep quality was bad but now I am seriously concerned about the impact it is having on me.'                        Medical History:   Diagnosis Date    Allergy 1992    seasonal allergies    Anxiety disorder 2017     Problem List:  2024-01: Intolerance of continuous positive airway pressure (CPAP)   ventilation  2024-01: History of sleep apnea  2023-09: OSA (obstructive sleep apnea)  2023-09: Overweight  2023-09: Psychophysiological insomnia  2021-07: Lumbar spondylosis  2019-11: Concentration deficit  2019-04: Bone cyst  of left tibia  2019-04: Left ankle instability  2019-04: Osteochondral defect  2018-12: Generalized anxiety disorder  2016-05: Knee pain, right  2016-05: Left ankle pain  1997-09: ADHD (attention deficit hyperactivity disorder), combined   type    Surgical History:   Procedure Laterality Date    ANKLE SURGERY Left     2018    HX TONSILLECTOMY  2012    SPINE SURGERY  2013    microdiscectomy     Family History   Problem Relation Age of Onset    Mental Illness Mother         ADHD, Anxiety    Hypertension Father     Mental Illness Father         ADHD, Anxiety    Mental Illness Sister         ADHD, Anxiety, Depression    Mental Illness Paternal Uncle         ADHD    Mental Illness Paternal Aunt         ADHD     Social History     Socioeconomic History    Marital status: Married   Tobacco Use    Smoking status: Never    Smokeless tobacco: Never   Substance and Sexual Activity    Alcohol use: Never    Drug use: Never    Sexual activity: Yes     Partners: Female     Birth control/protection: Pill         Objective:          amphetamine-dextroamphetamine XR (ADDERALL XR) 30 mg capsule Take one capsule by mouth every morning. Indications: attention deficit disorder with hyperactivity    cetirizine (ZYRTEC) 10 mg tablet Take one tablet by mouth daily.    cloNIDine HCL (CATAPRES) 0.3 mg tablet Take one tablet by mouth daily.    dextroamphetamine sulfate (DEXTROSTAT) 10 mg tablet Take one tablet by mouth twice daily.    venlafaxine XR (EFFEXOR XR) 75 mg capsule Take one capsule by mouth at bedtime daily.     There were no vitals filed for this visit.  There is no height or weight on file to calculate BMI.     Physical Exam  Physical exam limited due to telemedicine encounter. Patient is alert and oriented, not in any distress, breathing does not appear labored, making good eye contact, speech is comprehensible, face is symmetrical, moving both upper extremities well, mood appropriate, no agitation, thought content appears to be normal, answering questions appropriately.        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 Currently using clonidine as a sleep aid.  -Discontinue clonidine due to unfavorable side effects.  -Start Doxepin 10mg  at bedtime. Monitor for mood related side effects.  -Discontinue melatonin due to suspicion of advanced phase.  -Implement light box therapy with 10,000 lux light at 7 PM for 15 minutes.  -Adhere to consistent bedtime (no earlier than 10:30 PM) and wake up time.    Mild supine Sleep Apnea: Noted only in supine position.  -Advise non-supine sleep to alleviate snoring and mild sleep apnea.    Follow-up in sleep clinic in four months.    Patient was also counseled on good sleep hygiene, non-supine  sleep, avoidance of alcohol before bedtime, regular exercise as tolerated and weight loss to target body weight. Cautioned on the risks of operating motor vehicle or any heavy machinery when sleepy and advised against drowsy driving.   Ample opportunity was provided for questions and concerns. Patient is aware they can contact our office via phone or mychart for any further queries.        Thank you for the opportunity to participate in the care of this patient. We will continue to follow them in Sleep Medicine clinic.     Sincerely,  Asencion Islam, MD  Sleep Medicine     Assistant Professor   [facility]         Total Time Today was 40 minutes in the following activities: Preparing to see the patient, Obtaining and/or reviewing separately obtained history, Performing a medically appropriate examination and/or evaluation, Counseling and educating the patient/family/caregiver, Documenting clinical information in the electronic or other health record, and communicating results to the patient/family/caregiver and Care coordination.

## 2023-01-16 NOTE — Progress Notes
Obtained patient's verbal consent to treat them and their agreement to [facility] financial policy and NPP via this telehealth visit during the Coronavirus Public Health Emergency

## 2023-01-16 NOTE — Telephone Encounter
An encounter has been created for documentation only (often for preparation of an upcoming appointment or for follow up on orders/imaging or records received) and patient does not need contact RN and did not miss a phone call or appointment.     Pre-visit planning for upcoming appointment with Dr.Luthra    FUV    [id number] loss    History of sleep apnea    Intolerance of continuous positive airway pressure (CPAP) ventilation    Psychophysiological insomnia    -Last SS: 12/20/22     -LV: 11/11/22 with Dr. Noreene Filbert    -01/14/23- Pt reports 'started taking my antidepressants in September and the issue of waking up through out the night was present before that. I knew my sleep quality was bad but now I am seriously concerned about the impact it is having on me.'

## 2023-01-28 ENCOUNTER — Encounter: Admit: 2023-01-28 | Discharge: 2023-01-28 | Payer: 59

## 2023-01-29 ENCOUNTER — Encounter: Admit: 2023-01-29 | Discharge: 2023-01-29 | Payer: 59

## 2023-02-26 ENCOUNTER — Encounter: Admit: 2023-02-26 | Discharge: 2023-02-26 | Payer: 59

## 2023-03-24 ENCOUNTER — Encounter: Admit: 2023-03-24 | Discharge: 2023-03-24 | Payer: 59

## 2023-03-24 MED ORDER — VENLAFAXINE 75 MG PO CP24
75 mg | ORAL_CAPSULE | Freq: Every day | ORAL | 0 refills
Start: 2023-03-24 — End: ?

## 2023-03-24 MED ORDER — VENLAFAXINE 75 MG PO CP24
75 mg | ORAL_CAPSULE | Freq: Every day | ORAL | 3 refills | Status: AC
Start: 2023-03-24 — End: ?

## 2023-03-24 NOTE — Telephone Encounter
Some refill protocol elements NOT Met  Medication name: Effexor XR  Medication Strength: 37.5mg  capsule      Office visit due and Labs due   Valid encounter within last 6 months    LDL in normal range and within 360 days     Next office visit: 07/18/23.    Routed to Provider    Shelly Bombard, RN

## 2023-04-15 ENCOUNTER — Encounter: Admit: 2023-04-15 | Discharge: 2023-04-15 | Payer: 59

## 2023-04-15 DIAGNOSIS — F5104 Psychophysiologic insomnia: Secondary | ICD-10-CM

## 2023-04-15 MED ORDER — DOXEPIN 10 MG PO CAP
10 mg | ORAL_CAPSULE | Freq: Every evening | ORAL | 0 refills | Status: AC
Start: 2023-04-15 — End: ?

## 2023-04-15 NOTE — Telephone Encounter
Request received for refill on patient's Doxepin 10 mg.  Last office visit with Dr. Clarene Critchley on 01/16/23.  Last refilled on 01/16/23 with #90 and 0 refills.  Authorized #90 with 0 refills.     Pt has appointment on 05/19/23 with Dr. Clarene Critchley.

## 2023-04-17 ENCOUNTER — Encounter: Admit: 2023-04-17 | Discharge: 2023-04-17 | Payer: 59

## 2023-04-17 ENCOUNTER — Ambulatory Visit: Admit: 2023-04-17 | Discharge: 2023-04-18 | Payer: 59

## 2023-05-05 ENCOUNTER — Encounter: Admit: 2023-05-05 | Discharge: 2023-05-05 | Payer: 59

## 2023-05-05 DIAGNOSIS — Z8279 Family history of other congenital malformations, deformations and chromosomal abnormalities: Secondary | ICD-10-CM

## 2023-05-05 DIAGNOSIS — T7840XA Allergy, unspecified, initial encounter: Secondary | ICD-10-CM

## 2023-05-05 DIAGNOSIS — F419 Anxiety disorder, unspecified: Secondary | ICD-10-CM

## 2023-05-06 ENCOUNTER — Encounter: Admit: 2023-05-06 | Discharge: 2023-05-06 | Payer: 59

## 2023-05-14 ENCOUNTER — Encounter: Admit: 2023-05-14 | Discharge: 2023-05-14 | Payer: 59

## 2023-05-14 NOTE — Telephone Encounter
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  Frank Parrish is at low risk to be a carrier for the other conditions included in this assessment.    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 AAT is a major inhibitor of neutrophil elastase in the lower respiratory tract.  Decreased levels of AAT leave individuals vulnerable to progressive destruction of alveoli by neutrophil elastase-leading to emphysema.  In addition, AAT deficiency leads to liver disease in some patients.  There are more than thirty known variants of the gene for AAT.  95% of [place] Caucasians are of the MM phenotype with normal AAT production.  Classic AAT deficiency is caused by the ZZ phenotype which has only 12% of normal AAT serum levels.  The only other common phenotype of concern is the SZ type which has only about 35% of normal serum levels.   Many individuals are heterozygous carriers for AAT variants, PiMZ for instance, where AAT function is 60% of the norm.  Most AAT variant carriers (MZ for instance) are asymptomatic although they may have a slightly increased risk for lung disease.  PiZZ individuals have a significant risk for obstructive lung disease (particularly if they are smokers).    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 Commonly the pain involves the joints, abdomen and chest, but can happen in other parts of the body as well. A build-up of protein in the organs (amyloidosis) including the kidneys, can lead to kidney failure. Heterozygous carriers of Familial Mediterranean Fever are usually asymptomatic, but have occasionally been reported to exhibit mild to severe symptoms of the disease. Therefore, we would recommend that the patient make his primary care physician aware of these results if concerns arise.       Frank Parrish's carrier screening was negative for Alpha-1-Antitrypsin deficiency and Familial Mediterranean fever.      A full explanation of these disorders can be found on the laboratory report.  Genetic counseling is available at patient request to review the results of this carrier screening.      The patient was reminded that carrier screening may reduce but not eliminate the risk for the tested conditions.  In addition, this is not an exhaustive test for genetic disorders.  These results were communicated to [facility].

## 2023-05-16 ENCOUNTER — Encounter: Admit: 2023-05-16 | Discharge: 2023-05-16 | Payer: 59

## 2023-05-16 DIAGNOSIS — Z1379 Encounter for other screening for genetic and chromosomal anomalies: Secondary | ICD-10-CM

## 2023-05-19 ENCOUNTER — Ambulatory Visit: Admit: 2023-05-19 | Discharge: 2023-05-20 | Payer: 59

## 2023-05-19 ENCOUNTER — Encounter: Admit: 2023-05-19 | Discharge: 2023-05-19 | Payer: 59

## 2023-05-19 DIAGNOSIS — G4711 Idiopathic hypersomnia with long sleep time: Secondary | ICD-10-CM

## 2023-05-19 DIAGNOSIS — G4739 Other sleep apnea: Secondary | ICD-10-CM

## 2023-05-19 DIAGNOSIS — T7840XA Allergy, unspecified, initial encounter: Secondary | ICD-10-CM

## 2023-05-19 DIAGNOSIS — F419 Anxiety disorder, unspecified: Secondary | ICD-10-CM

## 2023-05-19 DIAGNOSIS — G4719 Other hypersomnia: Secondary | ICD-10-CM

## 2023-05-19 NOTE — Telephone Encounter
An encounter has been created for documentation only (often for preparation of an upcoming appointment or for follow up on orders/imaging or records received) and patient does not need contact RN and did not miss a phone call or appointment.     Pre-visit planning for upcoming appointment with Dr.Luthra    FUV    -MV:HQIONGE of sleep apnea    Psychophysiological insomnia    Chronic insomnia    -Last SS: 12/20/22     -LV: 01/16/23 with Dr. Clarene Critchley  Sleep Maintenance Insomnia: Likely psychophysiologic insomnia and advanced sleep wake phase. Currently using clonidine as a sleep aid.  -Discontinue clonidine due to unfavorable side effects.  -Start Doxepin 10mg  at bedtime. Monitor for mood related side effects.  -Discontinue melatonin due to suspicion of advanced phase.  -Implement light box therapy with 10,000 lux light at 7 PM for 15 minutes.  -Adhere to consistent bedtime (no earlier than 10:30 PM) and wake up time.     Mild supine Sleep Apnea: Noted only in supine position.  -Advise non-supine sleep to alleviate snoring and mild sleep apnea.     Follow-up in sleep clinic in four months.  -Meds: Doxepin 10 mg

## 2023-05-19 NOTE — Progress Notes
Date of Service: 05/19/2023    Lorrene Reid, MD  [address] [facility] 4th floor  [place],  [place] [address]    Dear Dr. Malachi Carl,            Frank Parrish is a very pleasant 32 y.o. male presenting to [facility] Sleep Medicine clinic for ongoing evaluation of Sleep Problem.      Subjective:     The patient, known to have ADHD and generalized anxiety disorder, presents with persistent sleep disturbances despite recent medication adjustments. He reports difficulty falling asleep, often taking up to 45 minutes to do so, and frequent nocturnal awakenings, approximately three times per night. Despite these disruptions, he is able to return to sleep within 15 minutes. The patient's sleep hygiene practices include using a body pillow, practicing mindfulness at bedtime, and using white noise. He also reports using a light box in the evening hours.    The patient's current medication regimen includes doxepin, clonidine, and venlafaxine at bedtime, and Adderall XR 30 mg and Adderall IR 10 mg twice daily for ADHD. He also takes Xyzal for allergies. Despite this regimen, the patient reports feeling generally tired during the day and often naps in the afternoon.    The patient's sleep disturbances have been somewhat resistant to recent medication changes. He was previously on gabapentin, which was discontinued, and doxepin was added. However, the patient reports no significant improvement with the addition of doxepin. He also reports a history of snoring and leg kicking at night, but deny any restless leg symptoms, sleep-related hallucinations, sleep paralysis, or sleepwalking.    The patient's daytime sleepiness persists despite a significant stimulant regimen for ADHD, which includes Adderall and dextroamphetamine. He also reports using caffeine, but within recommended limits. The patient's generalized anxiety disorder is reportedly well-managed with mindfulness practices and regular exercise. Sleep ROS: some snoring, no choking/gasping spells, no dry mouth, no bloating, no congestion, other sleep ROS as above.     ESS 05/19/2023:       05/19/2023     7:43 AM 01/16/2023    12:02 PM 12/18/2022    11:00 AM 11/11/2022    11:27 AM   Epworth Sleepiness Scale   Sitting and reading 1 2 2 1    Watching TV 1 2 2  0   Sitting inactive in a public place (e.g. a theater or a meeting) 0 0 0 0   As a passenger in a car for an hour without a break 2 0 1 0   Lying down to rest in the afternoon when circumstances permit 3 2 2 1    Sitting and talking to someone 0 0 0 0   Sitting quietly after a lunch without alcohol 1 1 1  0   In a car, while stopped for a few minutes in traffic 0 0 0 0   Epworth Sleepiness Scale Score 8 7 8 2            Pre-Visit planning:   FUV     -UE:[id number] of sleep apnea     Psychophysiological insomnia     Chronic insomnia     -Last SS: 12/20/22      -LV: 01/16/23 with Dr. Clarene Critchley  Sleep Maintenance Insomnia: Likely psychophysiologic insomnia and advanced sleep wake phase. Currently using clonidine as a sleep aid.  -Discontinue clonidine due to unfavorable side effects.  -Start Doxepin 10mg  at bedtime. Monitor for mood related side effects.  -Discontinue melatonin due to suspicion of advanced phase.  -  Implement light box therapy with 10,000 lux light at 7 PM for 15 minutes.  -Adhere to consistent bedtime (no earlier than 10:30 PM) and wake up time.     Mild supine Sleep Apnea: Noted only in supine position.  -Advise non-supine sleep to alleviate snoring and mild sleep apnea.     Follow-up in sleep clinic in four months.  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  2023-09: Overweight  2023-09: Psychophysiological insomnia  2021-07: Lumbar spondylosis  2019-11: Concentration deficit  2019-04: Bone cyst of left tibia  2019-04: Left ankle instability  2019-04: Osteochondral defect  2018-12: Generalized anxiety disorder  2016-05: Knee pain, right  2016-05: Left ankle pain  1997-09: ADHD (attention deficit hyperactivity disorder), combined   type    Surgical History:   Procedure Laterality Date    ANKLE SURGERY Left     2018    HX TONSILLECTOMY  2012    SPINE SURGERY  2013    microdiscectomy     Family History   Problem Relation Name Age of Onset    Mental Illness Mother Sinai Mahany         ADHD, Anxiety    Heart valvular Mother Aarik Blank         bicuspid aortic valve    Hypertension Father Tyleek Smick     Mental Illness Father Aydian Dimmick         ADHD, Anxiety    Mental Illness Sister [name]         ADHD, Anxiety, Depression    Mental Illness Paternal Aunt [name]         ADHD    Mental Illness Paternal Uncle [name]         ADHD     Social History     Socioeconomic History    Marital status: Married   Tobacco Use    Smoking status: Never    Smokeless tobacco: Never   Substance and Sexual Activity    Alcohol use: Never    Drug use: Never    Sexual activity: Yes     Partners: Female     Birth control/protection: Pill         Objective:          amphetamine-dextroamphetamine XR (ADDERALL XR) 30 mg capsule Take one capsule by mouth every morning. Indications: attention deficit disorder with hyperactivity    cetirizine (ZYRTEC) 10 mg tablet Take one tablet by mouth daily.    cloNIDine HCL (CATAPRES) 0.3 mg tablet Take one tablet by mouth daily.    dextroamphetamine sulfate (DEXTROSTAT) 10 mg tablet Take one tablet by mouth twice daily.    dextroamphetamine-amphetamine (ADDERALL) 10 mg tablet Take one tablet by mouth daily.    venlafaxine XR (EFFEXOR XR) 75 mg capsule Take one capsule by mouth daily. Indications: repeated episodes of anxiety     Vitals:    05/19/23 0827   BP: 122/77   Pulse: 71 Temp: 36.2 ?C (97.2 ?F)   Resp: 16   SpO2: 100%   TempSrc: Temporal   PainSc: Zero   Weight: 97.5 kg (215 lb)   Height: 182.9 cm (6')  Body mass index is 29.16 kg/m?Marland Kitchen       DIAGNOSTIC  Polysomnogram: No significant apnea         Physical Exam  Constitutional:       Appearance: Normal appearance.   Eyes:      Conjunctiva/sclera: Conjunctivae normal.      Pupils: Pupils are equal, round, and reactive to light.   Pulmonary:      Effort: Pulmonary effort is normal. No respiratory distress.   Skin:     General: Skin is warm and dry.   Neurological:      Mental Status: Alert and oriented to person, place, and time. Mental status is at baseline.   Psychiatric:         Mood and Affect: Mood normal.         Behavior: Behavior normal.          Assessment and Plan:  Problem List          SLEEP    Positional sleep apnea        Excessive daytime sleepiness             Insomnia: Difficulty falling asleep and maintaining sleep. Multiple awakenings at night. Tried Doxepin without significant improvement. Currently on Clonidine 0.3mg  at bedtime.  -Discontinue Doxepin due to lack of efficacy.  -Continue Clonidine 0.3mg  at bedtime.  -Implement sleep hygiene measures including going to bed later (around 10:30pm), using white noise, and maintaining a consistent sleep schedule.  -overall sleeping better, averaging around 7 hours in bed and sleeping 6 + hours at night based on his apple watch data.     Advised to continue side sleep for positional sleep apnea.     Excessive Daytime Sleepiness: Despite adequate sleep duration, reports persistent daytime sleepiness and need for napping. Currently on Adderall 30mg  XR in the morning and 10mg  immediate release twice daily.  -Suspect possible hypersomnia or narcolepsy contributing to excessive daytime sleepiness.  -Order polysomnogram followed by multiple sleep latency test (PSG MSLT) to evaluate for hypersomnia or narcolepsy. Patient to discontinue stimulants for a week prior to testing, follow sleep labs advice on other medications.     Generalized Anxiety Disorder: Reports managing anxiety with mindfulness and exercise. Currently on Venlafaxine.  -Continue current management strategies and Venlafaxine.    Allergic Rhinitis: Recently switched from Zyrtec to Xyzal (Levocetirizine).  -Continue Xyzal if it is more effective than Zyrtec.    Follow-up in 3-4 months after PSG MSLT results are available.       Patient was also counseled on good sleep hygiene, non-supine sleep, avoidance of alcohol before bedtime, regular exercise as tolerated and weight loss to target body weight. Cautioned on the risks of operating motor vehicle or any heavy machinery when sleepy and advised against drowsy driving.   Ample opportunity was provided for questions and concerns. Patient is aware they can contact our office via phone or mychart for any further queries.        Thank you for the opportunity to participate in the care of this patient. We will continue to follow them in Sleep Medicine clinic. Return in about 3 months (around 08/19/2023) for 3-66mIn-Person, Dr. Clarene Critchley, In-Person.    Total Time Today was 41 minutes in the following activities: Preparing to see the patient, Obtaining and/or reviewing separately obtained history, Performing a medically appropriate examination and/or evaluation, Counseling and educating the patient/family/caregiver, Documenting clinical information in the electronic or other health record, and communicating results to the patient/family/caregiver and Care coordination.  Sincerely,  Asencion Islam, MD  Sleep Medicine   Assistant Professor   [facility] Medicine    Orders Placed This Encounter    SLEEP STUDY

## 2023-06-11 ENCOUNTER — Encounter: Admit: 2023-06-11 | Discharge: 2023-06-11 | Payer: 59

## 2023-06-11 NOTE — Telephone Encounter
06/11/2023 No records to request at this time. PCP is internal and per decision tree patient does not remember previous cardiologist 20+ years ago.  sdc

## 2023-06-25 ENCOUNTER — Encounter: Admit: 2023-06-25 | Discharge: 2023-06-25 | Payer: 59

## 2023-06-25 ENCOUNTER — Ambulatory Visit: Admit: 2023-06-25 | Discharge: 2023-06-25 | Payer: 59

## 2023-06-25 DIAGNOSIS — Z8279 Family history of other congenital malformations, deformations and chromosomal abnormalities: Secondary | ICD-10-CM

## 2023-07-10 ENCOUNTER — Encounter: Admit: 2023-07-10 | Discharge: 2023-07-10 | Payer: 59

## 2023-07-10 ENCOUNTER — Ambulatory Visit: Admit: 2023-07-10 | Discharge: 2023-07-10 | Payer: 59

## 2023-07-10 DIAGNOSIS — Z8279 Family history of other congenital malformations, deformations and chromosomal abnormalities: Secondary | ICD-10-CM

## 2023-07-10 DIAGNOSIS — Z136 Encounter for screening for cardiovascular disorders: Secondary | ICD-10-CM

## 2023-07-10 DIAGNOSIS — E7800 Pure hypercholesterolemia, unspecified: Secondary | ICD-10-CM

## 2023-07-10 DIAGNOSIS — T7840XA Allergy, unspecified, initial encounter: Secondary | ICD-10-CM

## 2023-07-10 DIAGNOSIS — F419 Anxiety disorder, unspecified: Secondary | ICD-10-CM

## 2023-07-10 DIAGNOSIS — Z79899 Other long term (current) drug therapy: Secondary | ICD-10-CM

## 2023-07-10 NOTE — Progress Notes
Date of Service: 07/10/2023    Frank Parrish is a 32 y.o. male.       HPI   Pleasant 32 yo male being seen for cardiac evaluation.  He has a family history of bicuspid aortic valve, mild hyperlipidemia and resolved sleep apnea.  He is training for a Marathon in November. He has  not had any anginal symptoms.      He is married, expecting first child. Wife is transplant hepatologist. Recent genetic testing neg for 558 out of 560 variants. No cardiac variants identified.    Echocardiogram in August demonstrated normal LV size and function no wall motion abnormalities.  Normal biatrial size.  Aortic valve is tricuspid no stenosis or regurgitation.  Trace tricuspid regurgitation.    Non-smoker       Vitals:    07/10/23 1304   BP: 124/81   BP Source: Arm, Right Upper   Pulse: 67   SpO2: 100%   O2 Device: None (Room air)   PainSc: Zero   Weight: 97.5 kg (215 lb)   Height: 182.9 cm (6')     Body mass index is 29.16 kg/m?Marland Kitchen     Past Medical History  Patient Active Problem List    Diagnosis Date Noted    Pure hypercholesterolemia 07/10/2023    Family history of bicuspid aortic valve 07/10/2023    Excessive daytime sleepiness 05/19/2023    Intolerance of continuous positive airway pressure (CPAP) ventilation 11/14/2022    History of sleep apnea 11/14/2022    Positional sleep apnea 07/17/2022    Overweight 07/17/2022    Psychophysiological insomnia 07/17/2022    Lumbar spondylosis 05/18/2020    Concentration deficit 09/22/2018    Bone cyst of left tibia 02/18/2018     Formatting of this note might be different from the original.  Added automatically from request for surgery [id number]      Left ankle instability 02/18/2018     Formatting of this note might be different from the original.  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  Added automatically from request for surgery [id number]      Generalized anxiety disorder 10/04/2017    Knee pain, right 03/13/2015    Left ankle pain 03/13/2015    ADHD (attention deficit hyperactivity disorder), combined type 07/05/1996         Review of Systems   Constitutional: Negative.   HENT: Negative.     Eyes: Negative.    Cardiovascular: Negative.    Respiratory: Negative.     Endocrine: Negative.    Hematologic/Lymphatic: Negative.    Skin: Negative.    Musculoskeletal: Negative.    Gastrointestinal: Negative.    Genitourinary: Negative.    Neurological: Negative.    Psychiatric/Behavioral: Negative.     Allergic/Immunologic: Negative.        Physical Exam   Constitutional: He appears well-developed. No distress.   HENT:   Head: Normocephalic.   Cardiovascular: Normal rate, regular rhythm and normal heart sounds.   Pulmonary/Chest: Effort normal. No respiratory distress.   Abdominal: Normal appearance.   Musculoskeletal:         General: Normal range of motion.      Cervical back: Normal range of motion.   Neurological: He is alert and oriented to person, place, and time.   Skin: Skin is warm and dry.   Psychiatric: His behavior is normal.  Judgment and thought content normal.         Cardiovascular Studies      Cardiovascular Health Factors  Vitals BP Readings from Last 3 Encounters:   07/10/23 124/81   06/25/23 110/72   05/19/23 122/77     Wt Readings from Last 3 Encounters:   07/10/23 97.5 kg (215 lb)   06/25/23 95.3 kg (210 lb)   05/19/23 97.5 kg (215 lb)     BMI Readings from Last 3 Encounters:   07/10/23 29.16 kg/m?   06/25/23 28.48 kg/m?   05/19/23 29.16 kg/m?      Smoking Social History     Tobacco Use   Smoking Status Never   Smokeless Tobacco Never      Lipid Profile No results found for: CHOL  No results found for: HDL  No results found for: LDL  No results found for: TRIG   Blood Sugar No results found for: HGBA1C  No results found for: GLU, GLUF, GLUPOC       Problems Addressed Today  Encounter Diagnoses   Name Primary?    Pure hypercholesterolemia Yes Family history of bicuspid aortic valve     Encounter for long-term (current) use of high-risk medication     Screening for heart disease        Assessment and Plan     Family history of bicuspid aortic valve.  Recent echocardiogram sh tricuspid valve with normal function, trace tricuspid regurgitation.  He has not had any anginal symptoms.  We discussed repeating echocardiogram in 1 year.  Continue with heart healthy lifestyle habits    History of mild hyperlipidemia.  2019 LDL 166.  After lifestyle changes LDL decreased to 128 when checked in December 2022.  I recommended updating lipid panel APO B LP(a).  Continue with heart healthy nutrition.  Aim to have 4 servings of vegetables daily limit saturated fats.  CT calcium score to further risk stratify    Blood pressures normotensive    Exercise regimen is very good    BMI of 29.  Continue with exercise balanced meals portion control    History of sleep apnea resolved with lifestyle changes.  Has trouble staying asleep.  We discussed positive benefits of L theanine 100-200mg  in evening.  He follows with sleep medicine         Current Medications (including today's revisions)   amphetamine-dextroamphetamine XR (ADDERALL XR) 30 mg capsule Take one capsule by mouth every morning. Indications: attention deficit disorder with hyperactivity    cetirizine (ZYRTEC) 10 mg tablet Take one tablet by mouth daily.    cloNIDine HCL (CATAPRES) 0.3 mg tablet Take one tablet by mouth daily.    dextroamphetamine sulfate (DEXTROSTAT) 10 mg tablet Take one tablet by mouth twice daily.    dextroamphetamine-amphetamine (ADDERALL) 10 mg tablet Take one tablet by mouth daily.    venlafaxine XR (EFFEXOR XR) 75 mg capsule Take one capsule by mouth daily. Indications: repeated episodes of anxiety

## 2023-07-25 ENCOUNTER — Encounter: Admit: 2023-07-25 | Discharge: 2023-07-25 | Payer: 59

## 2023-07-25 ENCOUNTER — Ambulatory Visit: Admit: 2023-07-25 | Discharge: 2023-07-26 | Payer: 59

## 2023-07-25 DIAGNOSIS — Z0000 Encounter for general adult medical examination without abnormal findings: Secondary | ICD-10-CM

## 2023-07-25 DIAGNOSIS — E663 Overweight: Secondary | ICD-10-CM

## 2023-07-25 DIAGNOSIS — F902 Attention-deficit hyperactivity disorder, combined type: Secondary | ICD-10-CM

## 2023-07-25 DIAGNOSIS — I10 Essential (primary) hypertension: Secondary | ICD-10-CM

## 2023-07-25 MED ORDER — ZEPBOUND 2.5 MG/0.5 ML SC PNIJ
2.5 mg | SUBCUTANEOUS | 0 refills | Status: AC
Start: 2023-07-25 — End: ?
  Filled 2023-08-07: qty 2, 28d supply, fill #1

## 2023-07-25 NOTE — Progress Notes
Date of Service: 07/25/2023    Frank Parrish is a 32 y.o. male.  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     Subjective:              The patient, with a history of ADHD, presents for annual physical. Frank Parrish has concerns about weight gain despite maintaining a healthy lifestyle and regular exercise, including training for a marathon.  Frank Parrish is currently running about 36 miles per week. The patient has been logging food intake and reports no sensation of fullness, making it difficult to eat in moderation, especially in the evenings. The patient describes this as a feeling of restlessness during the 7 to 10 PM window.    The patient denies any other health concerns. The patient's wife is pregnant and due in December. The patient's blood pressure remains elevated. Frank Parrish remains on clonidine for ADHD in addition to Adderall XR 30 daily and     Past Medical History:   Diagnosis Date    Allergy 1992    seasonal allergies    Anxiety disorder 2017     Surgical History:   Procedure Laterality Date    ANKLE SURGERY Left     2018    HX TONSILLECTOMY  2012    SPINE SURGERY  2013    microdiscectomy     Family History   Problem Relation Name Age of Onset    Mental Illness Mother Frank Parrish         ADHD, Anxiety    Heart valvular Mother Frank Parrish         bicuspid aortic valve    Hypertension Father Frank Parrish     Mental Illness Father Frank Parrish         ADHD, Anxiety    Mental Illness Sister Frank Parrish         ADHD, Anxiety, Depression    Mental Illness Paternal Aunt Frank Parrish         ADHD    Mental Illness Paternal Uncle Frank Parrish         ADHD     Social History     Socioeconomic History    Marital status: Married   Tobacco Use    Smoking status: Never    Smokeless tobacco: Never   Substance and Sexual Activity    Alcohol use: Never    Drug use: Never    Sexual activity: Yes     Partners: Female     Birth control/protection: Pill                     Objective:          amphetamine-dextroamphetamine XR (ADDERALL XR) 30 mg capsule Take one capsule by mouth every morning. Indications: attention deficit disorder with hyperactivity    cetirizine (ZYRTEC) 10 mg tablet Take one tablet by mouth daily.    cloNIDine HCL (CATAPRES) 0.3 mg tablet Take one tablet by mouth daily.    dextroamphetamine-amphetamine (ADDERALL) 10 mg tablet Take one tablet by mouth twice daily. Indications: attention deficit disorder with hyperactivity    tirzepatide (weight loss) (ZEPBOUND) 2.5 mg/0.5 mL injection PEN Inject 0.5 mL under the skin every 7 days. Indications: weight loss management for overweight person with bmi 27 to 29 and weight-related comorbidity    venlafaxine XR (EFFEXOR XR)  75 mg capsule Take one capsule by mouth daily. Indications: repeated episodes of anxiety       Vitals:    07/25/23 1242   BP: 126/88   BP Source: Arm, Right Upper   Pulse: 63   Temp: 36.6 ?C (97.9 ?F)   Resp: 16   SpO2: 100%   TempSrc: Oral   PainSc: Zero   Height: 182.9 cm (6')     PHQ9       PHQ-2 Score: 0 (07/18/2023  9:30 AM)    No data recorded  No data recorded      Physical Exam  Vitals reviewed.   Constitutional:       Appearance: Normal appearance.   HENT:      Right Ear: Tympanic membrane and external ear normal.      Left Ear: Tympanic membrane and external ear normal.      Mouth/Throat:      Mouth: Mucous membranes are moist.   Eyes:      General: No scleral icterus.     Pupils: Pupils are equal, round, and reactive to light.   Cardiovascular:      Rate and Rhythm: Normal rate and regular rhythm.      Heart sounds: No murmur heard.  Pulmonary:      Effort: Pulmonary effort is normal.      Breath sounds: Normal breath sounds.   Abdominal:      General: There is no distension.      Palpations: There is no mass.      Tenderness: There is no abdominal tenderness. There is no guarding.   Musculoskeletal:         General: No swelling.   Skin:     General: Skin is warm.   Neurological:      General: No focal deficit present.      Mental Status: Frank Parrish is alert and oriented to person, place, and time.   Psychiatric:         Mood and Affect: Mood normal.         Behavior: Behavior normal.         Assessment and Plan:              Frank Parrish was seen today for physical.    Diagnoses and all orders for this visit:    Encounter for screening and preventative care  Discussed age and gender appopriate screening and preventative care. Provided counseling and anticipatory guidance as well as risk factor reduction interventions.   -Flu shot administered today.   -     COMPREHENSIVE METABOLIC PANEL; Future; Expected date: 07/25/2023  -     CBC AND DIFF; Future; Expected date: 07/25/2023  -     TSH WITH FREE T4 REFLEX; Future; Expected date: 07/25/2023    Overweight with body mass index (BMI) of 29 to 29.9 in adult  Overweight; Body mass index is 29.16 kg/m?Marland Kitchen Weight gain of >15 lbs in last 9 mo.   Frank Parrish has hx of obesity; despite efforts at weight loss > 6 months with regular exercise and dietary interventions (caloric tracking), Frank Parrish has continued to gain weight. 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 Given his comorbid conditions and BMI, Frank Parrish is a candidate for GLP/GIP weight loss medications.   -Check TSH  -Start Zepbound (terazepatide) at 2.5mg  weekly, self-administered subcutaneously. Counseled on  potential for adverse GI effects.   -Check tolerance (via MyChart messaging) after one month and consider increasing to 5mg  if well-tolerated.  -Ensure adequate hydration and protein intake while on medication.  -Continue regular exercise and mindfulness practices.  -Review in 8 weeks to assess weight loss progress.    Wt Readings from Last 5 Encounters:   07/10/23 97.5 kg (215 lb)   06/25/23 95.3 kg (210 lb)   05/19/23 97.5 kg (215 lb)   11/11/22 96.2 kg (212 lb)   11/01/22 89.4 kg (197 lb)     -     TSH WITH FREE T4 REFLEX; Future; Expected date: 07/25/2023  -     tirzepatide (weight loss) (ZEPBOUND) 2.5 mg/0.5 mL injection PEN; Inject 0.5 mL under the skin every 7 days. Indications: weight loss management for overweight person with bmi 27 to 29 and weight-related comorbidity    Screening for HIV (human immunodeficiency virus)  -     HIV 1 & 2 AG-AB SCRN W REFLEX TO HIV CONFIRMATION; Future; Expected date: 07/25/2023    Primary hypertension, stage 1  BP Readings from Last 5 Encounters:   07/25/23 126/88   07/10/23 124/81   06/25/23 110/72   05/19/23 122/77   11/11/22 (!) 136/96     Diastolic blood pressure slightly elevated, possibly due to ADHD medications. No immediate need for antihypertensive medication, especially considering ongoing weight loss efforts.  -Monitor blood pressure at home and report if consistently higher than 130/80.  -Consider dietary modifications such as increasing potassium intake and reducing salt intake.  -     COMPREHENSIVE METABOLIC PANEL; Future; Expected date: 07/25/2023  -     CBC AND DIFF; Future; Expected date: 07/25/2023  -     TSH WITH FREE T4 REFLEX; Future; Expected date: 07/25/2023    ADHD (attention deficit hyperactivity disorder), combined type  Currently managed with Adderall XR 30mg  in the morning, Adderall IR 10mg  BID. This is managed by outside psychiatrist. Continue clonidine 0.3 mg daily.   -Continue management by his psychiatrist.    Need for immunization  -     FLU VACCINE =>6 MO TRIVALENT  PF    Orders Placed This Encounter    FLU VACCINE =>6 MO TRIVALENT  PF    HIV 1 & 2 AG-AB SCRN W REFLEX TO HIV CONFIRMATION    COMPREHENSIVE METABOLIC PANEL today    CBC AND DIFF    TSH WITH FREE T4 REFLEX today    tirzepatide (weight loss) (ZEPBOUND) 2.5 mg/0.5 mL injection PEN       RTC in 2 months.     Lorrene Reid, MD

## 2023-07-25 NOTE — Progress Notes
Patient presents for INFLUENZA  vaccination.  Patient verified name and date of birth.  Consent (and ABN) signed for this vaccination.  Patient gave verbal consent for this LPN to administer.  Injected to Patient's RIGHT deltoid.  Patient tolerated well.  Vaccine Information Statement (VIS) sheet handed to patient. Health Maintenance updated.

## 2023-07-26 DIAGNOSIS — Z114 Encounter for screening for human immunodeficiency virus [HIV]: Secondary | ICD-10-CM

## 2023-07-26 DIAGNOSIS — Z6829 Body mass index (BMI) 29.0-29.9, adult: Secondary | ICD-10-CM

## 2023-07-30 ENCOUNTER — Encounter: Admit: 2023-07-30 | Discharge: 2023-07-30 | Payer: 59

## 2023-07-30 NOTE — Progress Notes
Pharmacy Benefits Investigation    Medication name: ZEPBOUND 2.5 MG/0.5 ML SC PNIJ    The insurance requires a prior authorization for the medication. The prior authorization was submitted via CoverMyMeds. Will follow up in 2 business days.    PA number: Ronnette Juniper  Specialty Pharmacy Patient Advocate

## 2023-07-31 ENCOUNTER — Encounter: Admit: 2023-07-31 | Discharge: 2023-07-31 | Payer: 59

## 2023-08-01 ENCOUNTER — Encounter: Admit: 2023-08-01 | Discharge: 2023-08-01 | Payer: 59

## 2023-08-01 NOTE — Progress Notes
Pharmacy Benefits Investigation    Medication name: ZEPBOUND 2.5 MG/0.5 ML SC PNIJ    The prior authorization was approved for [employer] (PA number [id number]) through 03/27/2024.    Prescription is on a nationwide backorder at this time. Patient will remain in queue for when medication becomes available    Arman Filter  Specialty Pharmacy Patient Advocate

## 2023-08-05 ENCOUNTER — Encounter: Admit: 2023-08-05 | Discharge: 2023-08-05 | Payer: 59

## 2023-08-05 NOTE — Progress Notes
Pharmacy Benefits Investigation    Medication name: ZEPBOUND 2.5 MG/0.5 ML SC PNIJ    The out of pocket cost today is $204.14 for 28 days. This cost may change due to factors including but not limited to changes in insurance coverage.    Copay assistance is not available. All available forms of copay assistance were evaluated and none are currently available for [employer].    Contacted Ign[facility] Felling Anguiano to discuss the approval and copay. Left voicemail asking patient to return call to the specialty pharmacy billing team at [phone].    Thornell Sartorius  Specialty Pharmacy Patient Advocate

## 2023-08-06 ENCOUNTER — Encounter: Admit: 2023-08-06 | Discharge: 2023-08-06 | Payer: 59

## 2023-08-24 ENCOUNTER — Encounter: Admit: 2023-08-24 | Discharge: 2023-08-24 | Payer: 59

## 2023-08-24 ENCOUNTER — Emergency Department: Admit: 2023-08-24 | Discharge: 2023-08-24 | Disposition: A | Discharge status: Home / Self Care | Payer: 59

## 2023-08-24 DIAGNOSIS — W540XXA Bitten by dog, initial encounter: Secondary | ICD-10-CM

## 2023-08-24 NOTE — Unmapped
You were seen for a dog bite today. Based on the [place] [employer] of [employer], rabies prophylaxis is not recommended. You elected to forego vaccination today and state you are UTD on tetanus immunization. Keep wounds clean and apply antibiotic ointment. Follow-up with your PCP for re-evaluation if needed. Otherwise, return to the ER for worsening and/or new concerning symptoms or if you desire rabies vaccination.

## 2023-08-24 NOTE — ED Notes
32 yo male presents to ED with CC of dog bite. Patient suffered a dog bite to the LLE yesterday afternoon. Patient states that the bite is not causing him any discomfort at this time but he was out running and doesn't know the owner or history of the dog so is coming in to be extra cautious and speak to a provider.     PMH of   Past Medical History:   Diagnosis Date    Allergy 1992    seasonal allergies    Anxiety disorder 2017     Surgical History:   Procedure Laterality Date    ANKLE SURGERY Left     2018    HX TONSILLECTOMY  2012    SPINE SURGERY  2013    microdiscectomy     Pt A&O x 4, connected to monitor, VSS, patent airway, breathing non-labored, and pulses palpable. RN notes bed is in lowest position, side rails up, and call light within reach.

## 2023-08-24 NOTE — ED Provider Notes
Frank Parrish is a 32 y.o. male.    Chief Complaint:  Chief Complaint   Patient presents with    Dog Bite       History of Present Illness:  Patient is a 32 year old male who presents to the emergency department for a dog bite.  Patient was on a long run yesterday when a dog bit him on the right leg.  Pt states a well-groomed appearing man was walking the dog, so the dog is likely domesticated.  However, patient does not know who the man was and did not ask if the dog was up-to-date on shots.  Patient states the dog did not seem unprovoked and just seemed scared because he was running close to it.  Patient continued on running because he did not have much pain.  He has a couple of superficial abrasions to the left knee that he is not concerned about and has no complaints related to the injury.  He got home and realized he was not sure about the dog's vaccination status and wanted to discuss if he needed rabies exposure prophylaxis treatment with a medical provider.  Patient states he just had a tetanus shot 2 weeks ago because his wife is pregnant.          Review of Systems:  Review of Systems   Constitutional:  Negative for chills and fever.   Respiratory:  Negative for shortness of breath.    Gastrointestinal:  Negative for abdominal pain, nausea and vomiting.   Musculoskeletal:  Negative for arthralgias, gait problem, joint swelling and myalgias.   Skin:  Positive for wound (abrasions left knee). Negative for rash.   Neurological:  Negative for dizziness, weakness, light-headedness, numbness and headaches.   All other systems reviewed and are negative.      Allergies:  Patient has no known allergies.    Past Medical History:  Past Medical History:   Diagnosis Date    Allergy 1992    seasonal allergies    Anxiety disorder 2017       Past Surgical History:  Surgical History:   Procedure Laterality Date    ANKLE SURGERY Left     2018    HX TONSILLECTOMY  2012    SPINE SURGERY  2013    microdiscectomy Pertinent medical/surgical history reviewed  Past Medical History:   Diagnosis Date    Allergy 1992    seasonal allergies    Anxiety disorder 2017     Surgical History:   Procedure Laterality Date    ANKLE SURGERY Left     2018    HX TONSILLECTOMY  2012    SPINE SURGERY  2013    microdiscectomy       Social History:  Social History     Tobacco Use    Smoking status: Never    Smokeless tobacco: Never   Substance Use Topics    Alcohol use: Never    Drug use: Never     Social History     Substance and Sexual Activity   Drug Use Never             Family History:  Family History   Problem Relation Name Age of Onset    Mental Illness Mother Junus Sawdy         ADHD, Anxiety    Heart valvular Mother Carmon Widmaier         bicuspid aortic valve    Hypertension Father Constance Ancheta  Mental Illness Father Rabon Hagee         ADHD, Anxiety    Mental Illness Sister [name]         ADHD, Anxiety, Depression    Mental Illness Paternal Aunt Beaux Rahlf         ADHD    Mental Illness Paternal Uncle [name]         ADHD       Vitals:  ED Vitals      Date and Time T BP P RR SPO2P SPO2 User   08/24/23 1300 36.9 ?C (98.5 ?F) 124/44 82 16 PER MINUTE -- 98 % AH            Physical Exam:  Physical Exam  Vitals and nursing note reviewed.   Constitutional:       General: He is not in acute distress.     Appearance: Normal appearance. He is normal weight.   HENT:      Head: Normocephalic and atraumatic.      Mouth/Throat:      Mouth: Mucous membranes are moist.   Eyes:      Extraocular Movements: Extraocular movements intact.      Pupils: Pupils are equal, round, and reactive to light.   Cardiovascular:      Rate and Rhythm: Normal rate and regular rhythm.      Pulses: Normal pulses.   Pulmonary:      Effort: Pulmonary effort is normal.   Abdominal:      General: Abdomen is flat.   Musculoskeletal:         General: No swelling or tenderness. Normal range of motion.      Cervical back: Normal range of motion. Left knee: No swelling. Normal range of motion. No tenderness.      Comments: Few small superficial abrasions to the left knee, no bleeding or drainage. No surrounding erythema, bruising, swelling, or warmth.   Skin:     General: Skin is warm and dry.      Capillary Refill: Capillary refill takes less than 2 seconds.      Findings: No rash.   Neurological:      Mental Status: He is alert and oriented to person, place, and time.      Sensory: No sensory deficit.      Motor: No weakness.   Psychiatric:         Mood and Affect: Mood normal.         Behavior: Behavior normal.         Laboratory Results:  Labs Reviewed - No data to display       Radiology Interpretation:  No orders to display       EKG:      Medical Decision Making:  Kaide Klebe is a 32 y.o. male who presents with chief complaint as listed above. Based on the history and presentation, the list of differential diagnoses considered included, but was not limited to, dog bite    ED Course  Patient is a 32 year old male who presents to the ED for a dog bite to the left knee that occurred yesterday when he was out running (see full HPI above).     Patient's vital signs are stable.  He has a few small superficial abrasions to the left knee, no swelling, erythema, ecchymosis, drainage, or warmth noted.  Patient denies leg or knee pain or any difficulty with ambulation.    Patient states he presented to the  ED to inquire about whether he needs to be vaccinated against rabies or not.  He is up-to-date on tetanus immunization.  The patient and I worked through the [employer] rabies exposure Engineer, production.  Based on this tool, rabies PEP is not recommended.  Via shared decision making, patient elected not to receive rabies immunoglobulin and vaccination at this time.    Discussed wound care, follow-up and ED return precautions.  Patient verbalized understanding and agrees with discharge plan.       Complexity of Problems Addressed  Patient's active diagnoses as well as contributing pre-existing medical problems include:  Clinical Impression   Dog bite, initial encounter          Additional data reviewed:    History was obtained from an independent historian: Not in addition to what is mentioned above  Prior non-ED notes reviewed: Not in addition to what is mentioned above  Independent interpretation of diagnostic tests was performed by me: Not in addition to what is mentioned above  Patient presentation/management was discussed with the following qualified health care professionals and/or other relevant professionals: Not in addition to what is mentioned above    Risk evaluation:    Diagnosis or treatment of patient condition impacted by social determinant of health: None  Tests Considered but not performed due to clinical scoring (if not mentioned in ED course, aside from what is implied by clinical scores listed):   Rationale regarding whether admission or escalation of care considered if not performed (if not mentioned in ED course, aside from what is implied by clinical scores listed):     ED Scoring:                                Facility Administered Meds:  Medications - No data to display    Clinical Impression:  Clinical Impression   Dog bite, initial encounter       Disposition/Follow up  ED Disposition     ED Disposition   Discharge           Altenhofen, Rosina Lowenstein, MD  [address]th floor  [place] [address]  [phone]            Medications:  Discharge Medication List as of 08/24/2023  2:18 PM          Procedure Notes:  Procedures       Attestation / Supervision:  The service was provided by the APP alone with immediate availability of a physician in the ED.      Chauncey Cruel, APRN-NP

## 2023-08-26 ENCOUNTER — Encounter: Admit: 2023-08-26 | Discharge: 2023-08-26 | Payer: 59

## 2023-08-26 NOTE — Telephone Encounter
ED Discharge Follow Up  Reached patient: No. Sent MyChart message per protocol.   Patient Date of Birth: 1990/12/10  Admission Information  Hospital Name : [facility]  ED Admission Date: 08/24/23   ED Discharge Date: 08/24/23   Admission Diagnosis:  Dog Bite  Discharge Diagnosis: Dog Bite, initial encounter   Hospital Services: Unplanned  Today's call is 2 (calendar) days post discharge    ED Course:  Patient states he presented to the ED to inquire about whether he needs to be vaccinated against rabies or not. He is up-to-date on tetanus immunization. The patient and I worked through the [employer] rabies exposure Engineer, production. Based on this tool, rabies PEP is not recommended. Via shared decision making, patient elected not to receive rabies immunoglobulin and vaccination at this time.     Medication Reconciliation  Changes to pre-ED visit medications? No  Were new prescriptions filled? N/A    Augmentin prescribed at [facility] on 08/23/23.    Meds reviewed and reconciled? Yes, same as AVS  Current Outpatient Medications   Medication Instructions    amphetamine-dextroamphetamine XR (ADDERALL XR) 30 mg capsule 30 mg, Oral, EVERY MORNING    cetirizine (ZYRTEC) 10 mg tablet 1 tablet, Oral, DAILY    cloNIDine HCL (CATAPRES) 0.3 mg, Oral, DAILY    dextroamphetamine-amphetamine (ADDERALL) 10 mg tablet 10 mg, Oral, TWICE DAILY    venlafaxine XR (EFFEXOR XR) 75 mg, Oral, DAILY    ZEPBOUND 2.5 mg, Subcutaneous, EVERY  7 DAYS      Scheduling Follow-up Appointment  Upcoming appointments:   Future Appointments   Date Time Provider Department Center   09/24/2023 10:00 AM Flanagin, Clide Dales, APRN-NP [facility]     When was patient?s last PCP visit: 07/25/2023  PCP primary location[facility]  PCP appointment scheduled? No, routine appt on 09/24/23  Specialist appointment scheduled? No  Is assistance with transportation needed? No  MyChart message sent? Active in MyChart. MyChart message sent.  Artera text sent? No    ED Communication   Did patient call clinic prior to going to ED? No. Seen at [facility] on 08/23/23.  Reason patient went to ED: Unable to obtain    Marthann Schiller, RN

## 2023-09-02 ENCOUNTER — Encounter: Admit: 2023-09-02 | Discharge: 2023-09-02 | Payer: 59

## 2023-09-02 MED ORDER — ZEPBOUND 5 MG/0.5 ML SC PNIJ
5 mg | SUBCUTANEOUS | 0 refills | Status: AC
Start: 2023-09-02 — End: ?
  Filled 2023-09-05: qty 2, 28d supply, fill #1

## 2023-09-03 ENCOUNTER — Encounter: Admit: 2023-09-03 | Discharge: 2023-09-03 | Payer: PRIVATE HEALTH INSURANCE

## 2023-09-04 ENCOUNTER — Encounter: Admit: 2023-09-04 | Discharge: 2023-09-04 | Payer: PRIVATE HEALTH INSURANCE

## 2023-09-05 ENCOUNTER — Encounter: Admit: 2023-09-05 | Discharge: 2023-09-05 | Payer: PRIVATE HEALTH INSURANCE

## 2023-09-11 ENCOUNTER — Encounter: Admit: 2023-09-11 | Discharge: 2023-09-11 | Payer: PRIVATE HEALTH INSURANCE

## 2023-09-24 ENCOUNTER — Encounter: Admit: 2023-09-24 | Discharge: 2023-09-24 | Payer: PRIVATE HEALTH INSURANCE

## 2023-09-24 ENCOUNTER — Ambulatory Visit: Admit: 2023-09-24 | Discharge: 2023-09-24 | Payer: 59

## 2023-09-24 ENCOUNTER — Ambulatory Visit: Admit: 2023-09-24 | Discharge: 2023-09-24 | Payer: PRIVATE HEALTH INSURANCE

## 2023-09-24 DIAGNOSIS — R030 Elevated blood-pressure reading, without diagnosis of hypertension: Secondary | ICD-10-CM

## 2023-09-24 DIAGNOSIS — Z6829 Body mass index (BMI) 29.0-29.9, adult: Secondary | ICD-10-CM

## 2023-09-24 DIAGNOSIS — E7800 Pure hypercholesterolemia, unspecified: Secondary | ICD-10-CM

## 2023-09-24 MED ORDER — ZEPBOUND 7.5 MG/0.5 ML SC PNIJ
7.5 mg | SUBCUTANEOUS | 1 refills | Status: AC
Start: 2023-09-24 — End: ?
  Filled 2023-09-26: qty 2, 28d supply, fill #1

## 2023-09-24 NOTE — Patient Instructions
It was nice to see you today! Thank you for coming into our clinic.    Please notify the clinic or go to the emergency room if you develop sudden worsening of symptoms, fevers, chest pain, difficulty breathing, or other concerning symptoms.     Please call my nurse if you have any further questions or concerns, at [phone]. You can leave a voicemail if needed. You can also reach [place] by sending a secure message through MyChart.    Helpful Information:  We will contact you by MyChart or phone with any test results when they are available.   If you are expecting results and have not heard from [place] within 2 weeks of your testing, please send a MyChart message or call the clinic.  Please use the MyChart Refill request or contact your pharmacy directly to request medication refills. - Please allow at least 72 hours for refill requests.  For appointments: Our Scheduling phone number is [phone].   If you were expecting a phone call to schedule a referral and haven't heard from the referring office within two weeks, please let us know.    Take care,  Ailene Ravel, DNP, APRN, FNP-BC      No future appointments.

## 2023-09-24 NOTE — Progress Notes
Date of Service: 09/24/2023  MRN#: [id number]  DOB: [birth year]  PCP: Lorrene Reid    Subjective:   Chief Complaint   Patient presents with    Weight Follow-up     Feeling full more quickly but has not noticed its having a huge impact on dietary habits. Would tolerate going up.    Noticing some heart burn at night. Last meal is sometimes between 6-8 taking tums about every other day. Around 7-8 pm and it seems to control everything pretty well.      Frank Parrish is a 32 y.o.male patient who presents for 2 month follow-up.     Patient Reported Other  What topic(s) would you like to cover during your appointment?:  Zepbound efficacy  Please describe the issue(s) and history with the issue (location, severity, duration, symptoms, etc.).:  Overweight  What has been done so far to take care of the issue(s)?:  Mindful eating, exercise (30+ miles run per week + strength training)  What are your goals for this visit?:  Check in on zepbound treatment and continue treatment at a higher dose    Patient presents today for 33-month follow-up on weight management.  He was last seen in clinic on 07/25/23 and started on Zepbound.  He has been on the 5 mg dose for a couple weeks and reports overall he is doing well.  The only negative side effect he has noticed is some heartburn which is resolved with Tums.  He has not had any weight loss yet, but does notice earlier satiety.  The medication helps him with snacking and his emotional eating voice is quieter.  He does track his calories and exercise routinely.  He completed a marathon roughly 2 weeks ago and notes that his hamstring is still very tight, so he has been exercising less.  He has had a significant amount of life stressors recently.  He and his wife moved halfway across the country and are adjusting to that as well as her new job.  They also recently bought a house and are going to have a baby any day.    He has not been routinely monitoring his blood pressure at home, but does have a blood pressure cuff.  He was started on clonidine by his previous psychiatrist to help with sleep.  He currently follows with outside psychiatrist, Dr. Nolen Mu.  He is on Adderall and venlafaxine.    Past Medical History:   Diagnosis Date    Allergy 1992    seasonal allergies    Anxiety disorder 2017       Review of Systems   See HPI   Objective:     Medication:   amphetamine-dextroamphetamine XR (ADDERALL XR) 30 mg capsule Take one capsule by mouth every morning. Indications: attention deficit disorder with hyperactivity    cetirizine (ZYRTEC) 10 mg tablet Take one tablet by mouth daily.    cloNIDine HCL (CATAPRES) 0.3 mg tablet Take one tablet by mouth daily.    dextroamphetamine-amphetamine (ADDERALL) 10 mg tablet Take one tablet by mouth twice daily. Indications: attention deficit disorder with hyperactivity    tirzepatide (weight loss) (ZEPBOUND) 7.5 mg/0.5 mL injection PEN Inject 0.5 mL under the skin every 7 days.    venlafaxine XR (EFFEXOR XR) 75 mg capsule Take one capsule by mouth daily. Indications: repeated episodes of anxiety       Vitals:    09/24/23 1003   BP: (!) 131/93   BP Source: Arm, Left  Upper   Pulse: 69   Temp: 36.6 ?C (97.9 ?F)   Resp: 12   SpO2: 100%   TempSrc: Oral   PainSc: Zero   Weight: 98.9 kg (218 lb)   Height: 182.9 cm (6')       Body mass index is 29.57 kg/m?Marland Kitchen    Physical Exam  Constitutional:       General: He is not in acute distress.     Appearance: Normal appearance. He is normal weight. He is not ill-appearing.   HENT:      Head: Normocephalic and atraumatic.   Cardiovascular:      Rate and Rhythm: Normal rate and regular rhythm.      Pulses: Normal pulses.      Heart sounds: No murmur heard.  Pulmonary:      Effort: Pulmonary effort is normal. No respiratory distress.      Breath sounds: Normal breath sounds.   Musculoskeletal:      Cervical back: Normal range of motion.   Skin:     General: Skin is warm and dry.   Neurological: General: No focal deficit present.      Mental Status: He is alert and oriented to person, place, and time.   Psychiatric:         Mood and Affect: Mood normal.         Behavior: Behavior normal.         Thought Content: Thought content normal.          Assessment and Plan:    BMI 29.0-29.9,adult (Primary)  Pure hypercholesterolemia  Elevated BP without diagnosis of hypertension  - Patient presents today for 33-month follow-up on weight management; he was last seen in clinic on 07/25/23 and started on Zepbound  - He has been on the 5 mg dose for a couple weeks and reports overall he is doing well, he has not had any significant weight loss  - He is agreeable to increasing his Zepbound to 7.5 mg after completing his 5 mg dose  - tirzepatide (weight loss) (ZEPBOUND) 7.5 mg/0.5 mL injection PEN; Inject 0.5 mL under the skin every 7 days.  Dispense: 2 mL; Refill: 1  - Does note significant life changes in the past few months and his wife is going to have a baby any day; he is still trying his best to monitor his calorie intake and exercise routinely, completed a marathon 33 weeks ago  - He has not been monitoring his blood pressure routinely at home, but does have a cuff and will start; goal of less than 130/80  - He will send [place] his blood pressure readings via MyChart and we will determine if he needs to start medication  - RTC in 2 months for follow-up, telehealth ok     Orders Placed This Encounter    tirzepatide (weight loss) (ZEPBOUND) 7.5 mg/0.5 mL injection PEN       Total of 30 minutes were spent on the same day of the visit including preparing to see the patient, obtaining and/or reviewing separately obtained history, performing a medically appropriate examination and/or evaluation, counseling and educating the patient/family/caregiver, ordering medications, tests, or procedures, referring and communication with other health care professionals, documenting clinical information in the electronic or other health record, independently interpreting results and communicating results to the patient/family/caregiver, and care coordination. I used a dictation program when conducting this note. Due to this, there may be grammatical errors or inconsistencies. For clarification or questions, please contact me.  Steward Ros, APRN-NP  Visit Disposition       Dispositions Check-out Note    Return in about 2 months (around 11/24/2023) for Telehealth.   Please schedule telehealth with Duwayne Heck, Tuesday or Thursday. Thanks!           Future Appointments   Date Time Provider Department Center   11/25/2023 11:00 AM Kinlie Janice, Clide Dales, APRN-NP [facility]     Patient Instructions   It was nice to see you today! Thank you for coming into our clinic.    Please notify the clinic or go to the emergency room if you develop sudden worsening of symptoms, fevers, chest pain, difficulty breathing, or other concerning symptoms.     Please call my nurse if you have any further questions or concerns, at [phone]. You can leave a voicemail if needed. You can also reach [place] by sending a secure message through MyChart.    Helpful Information:  We will contact you by MyChart or phone with any test results when they are available.   If you are expecting results and have not heard from [place] within 2 weeks of your testing, please send a MyChart message or call the clinic.  Please use the MyChart Refill request or contact your pharmacy directly to request medication refills. - Please allow at least 72 hours for refill requests.  For appointments: Our Scheduling phone number is [phone].   If you were expecting a phone call to schedule a referral and haven't heard from the referring office within two weeks, please let us know.    Take care,  Ailene Ravel, DNP, APRN, FNP-BC      No future appointments.

## 2023-09-25 ENCOUNTER — Encounter: Admit: 2023-09-25 | Discharge: 2023-09-25 | Payer: PRIVATE HEALTH INSURANCE

## 2023-09-25 ENCOUNTER — Encounter: Admit: 2023-09-25 | Discharge: 2023-09-25 | Payer: 59

## 2023-09-25 DIAGNOSIS — E7800 Pure hypercholesterolemia, unspecified: Secondary | ICD-10-CM

## 2023-09-25 DIAGNOSIS — Z79899 Other long term (current) drug therapy: Secondary | ICD-10-CM

## 2023-09-26 ENCOUNTER — Encounter: Admit: 2023-09-26 | Discharge: 2023-09-26 | Payer: PRIVATE HEALTH INSURANCE

## 2023-10-19 ENCOUNTER — Encounter: Admit: 2023-10-19 | Discharge: 2023-10-19 | Payer: PRIVATE HEALTH INSURANCE

## 2023-10-21 ENCOUNTER — Encounter: Admit: 2023-10-21 | Discharge: 2023-10-21 | Payer: PRIVATE HEALTH INSURANCE

## 2023-10-23 ENCOUNTER — Encounter: Admit: 2023-10-23 | Discharge: 2023-10-23 | Payer: PRIVATE HEALTH INSURANCE

## 2023-10-24 ENCOUNTER — Encounter: Admit: 2023-10-24 | Discharge: 2023-10-24 | Payer: PRIVATE HEALTH INSURANCE

## 2023-10-24 MED FILL — ZEPBOUND 10 MG/0.5 ML SC PNIJ: 100.5 mg/0.5 mL | SUBCUTANEOUS | 28 days supply | Qty: 2 | Fill #1 | Status: AC

## 2023-11-18 ENCOUNTER — Encounter: Admit: 2023-11-18 | Discharge: 2023-11-18 | Payer: PRIVATE HEALTH INSURANCE

## 2023-11-19 ENCOUNTER — Encounter: Admit: 2023-11-19 | Discharge: 2023-11-19 | Payer: PRIVATE HEALTH INSURANCE

## 2023-11-19 MED FILL — ZEPBOUND 10 MG/0.5 ML SC PNIJ: 100.5 mg/0.5 mL | SUBCUTANEOUS | 28 days supply | Qty: 2 | Fill #2 | Status: AC

## 2023-11-25 ENCOUNTER — Encounter: Admit: 2023-11-25 | Discharge: 2023-11-25 | Payer: PRIVATE HEALTH INSURANCE

## 2023-11-25 ENCOUNTER — Ambulatory Visit: Admit: 2023-11-25 | Discharge: 2023-11-26 | Payer: 59

## 2023-11-25 DIAGNOSIS — Z8669 Personal history of other diseases of the nervous system and sense organs: Secondary | ICD-10-CM

## 2023-11-25 DIAGNOSIS — K219 Gastro-esophageal reflux disease without esophagitis: Secondary | ICD-10-CM

## 2023-11-25 DIAGNOSIS — E663 Overweight: Secondary | ICD-10-CM

## 2023-11-25 MED ORDER — OMEPRAZOLE 40 MG PO CPDR
40 mg | ORAL_CAPSULE | Freq: Every day | ORAL | 1 refills | Status: AC
Start: 2023-11-25 — End: ?
  Filled 2023-11-26: qty 30, 30d supply, fill #1

## 2023-11-25 MED ORDER — ZEPBOUND 12.5 MG/0.5 ML SC PNIJ
12.5 mg | SUBCUTANEOUS | 2 refills | Status: AC
Start: 2023-11-25 — End: ?
  Filled 2023-12-04: qty 2, 28d supply, fill #1

## 2023-11-25 NOTE — Patient Instructions
 It was nice to see you today! Thank you for coming into our clinic.    Please notify the clinic or go to the emergency room if you develop sudden worsening of symptoms, fevers, chest pain, difficulty breathing, or other concerning symptoms.     Please call my nurse if you have any further questions or concerns, at [phone]. You can leave a voicemail if needed. You can also reach [place] by sending a secure message through MyChart.    Helpful Information:  We will contact you by MyChart or phone with any test results when they are available.   If you are expecting results and have not heard from [place] within 2 weeks of your testing, please send a MyChart message or call the clinic.  Please use the MyChart Refill request or contact your pharmacy directly to request medication refills. - Please allow at least 72 hours for refill requests.  For appointments: Our Scheduling phone number is [phone].   If you were expecting a phone call to schedule a referral and haven't heard from the referring office within two weeks, please let us know.    Take care,  Ailene Ravel, DNP, APRN, FNP-BC      No future appointments.

## 2023-11-25 NOTE — Progress Notes
Obtained patient's verbal consent to treat them and their agreement to [facility] financial policy and NPP via this telehealth visit during the [facility] Emergency.    Date of Service: 11/25/2023    Frank Parrish is a 33 y.o. male. DOB: [birth year]   MRN#: [id number]    Subjective:   Chief Complaint   Patient presents with    Follow Up     Concerns with reddness and rash near the injection site for medication started about 1 month ago maybe 5 weeks. Just some itching and reddness slightly bigger than a quarter in size.           Frank Parrish is 33 y.o. patient who presents for telehealth visit.   Patient Reported Other  What topic(s) would you like to cover during your appointment?:  Zepbound progress and rash at injection site  Please describe the issue(s) and history with the issue (location, severity, duration, symptoms, etc.).:  Starting about a month ago, I started having redness and itchiness at the injection site for the medication.  What has been done so far to take care of the issue(s)?:  Nothing at this point.  What are your goals for this visit?:  Address concerns about irritation at the injection site. Get a prescription for Prilosec so that it will be covered by insurance. Discuss increasing dosage or changing medication if necessary.    Patient presents today for 65-month follow-up on weight management.  Since his last visit, he and his wife now have a 72-month-old baby.  He is doing well and enjoying being a father.  He is currently on parental leave.    He is overall tolerating 10 mg of Zepbound well.  No significant negative side effects.  He is taking Metamucil daily and this has helped with mild constipation.  He has been taking Prilosec for symptoms of GERD, which has helped with upset stomach while running.  He has noticed for the past 4 to 6 weeks, he has developed some mild erythema and itchiness to his injection site.  It is about the size of a quarter and will last for approximately 1 week.  He notes that the symptoms are not unbearable, but are noticeable.  He has not tried any topical cream.  With the 10 mg dose, his appetite is more than what he would expect.  He does feel fuller sooner and is tracking calories.  He reports adequate protein intake.  He is still distance running, running about 90 minutes 3 times a week.  He is also weightlifting 3 times per week.  His last weight was 203 pounds.  His goal weight is 175.    BP Readings from Last 5 Encounters:   09/24/23 (!) 131/93   08/24/23 124/44   07/25/23 126/88   07/10/23 124/81   06/25/23 110/72     Wt Readings from Last 5 Encounters:   11/25/23 92.1 kg (203 lb)   09/24/23 98.9 kg (218 lb)   08/24/23 94.3 kg (208 lb)   07/10/23 97.5 kg (215 lb)   06/25/23 95.3 kg (210 lb)       Past Medical History:    Allergy    Anxiety disorder     family history includes Heart valvular in his mother; Hypertension in his father; Mental Illness in his father, mother, paternal aunt, paternal uncle, and sister.    Social History     Socioeconomic History    Marital status: Married   Tobacco  Use    Smoking status: Never    Smokeless tobacco: Never   Substance and Sexual Activity    Alcohol use: Never    Drug use: Never    Sexual activity: Yes     Partners: Female     Birth control/protection: Pill       Social History     Tobacco Use    Smoking status: Never    Smokeless tobacco: Never   Substance Use Topics    Alcohol use: Never        ROS  See HPI   Objective:      amphetamine-dextroamphetamine XR (ADDERALL XR) 30 mg capsule Take one capsule by mouth every morning. Indications: attention deficit disorder with hyperactivity    cetirizine (ZYRTEC) 10 mg tablet Take one tablet by mouth daily.    cloNIDine HCL (CATAPRES) 0.3 mg tablet Take one tablet by mouth daily.    dextroamphetamine-amphetamine (ADDERALL) 10 mg tablet Take one tablet by mouth twice daily. Indications: attention deficit disorder with hyperactivity    tirzepatide (weight loss) (ZEPBOUND) 10 mg/0.5 mL injection PEN Inject 0.5 mL under the skin every 7 days.    venlafaxine XR (EFFEXOR XR) 75 mg capsule Take one capsule by mouth daily. Indications: repeated episodes of anxiety     Vitals:    11/25/23 1117   Weight: 92.1 kg (203 lb)     Body mass index is 27.53 kg/m?Marland Kitchen     Physical Exam  Constitutional:       General: He is not in acute distress.     Appearance: Normal appearance. He is not ill-appearing.   HENT:      Head: Normocephalic and atraumatic.   Pulmonary:      Effort: Pulmonary effort is normal.   Musculoskeletal:      Cervical back: Normal range of motion.   Neurological:      Mental Status: He is alert.   Psychiatric:         Mood and Affect: Mood normal.         Behavior: Behavior normal.         Thought Content: Thought content normal.         Greater than 30 minutes spent during this patient encounter with >50% of the time counseling and coordinating care. Consultation time included patient assessment, obtaining patient history, discussion of medical issues, discussing available data and test results, and educating patient regarding conditions and treatment options as well as potential risks and benefits. Additional time was spent on documentation, reviewing patient history and prior office notes, interpreting data and test results, and ordering medications, tests, and procedures as appropriate.          Assessment and Plan:    Overweight (Primary)  History of sleep apnea  - Patient presents today for 3 month follow-up on weight management  - He has had a 12 pound weight loss, goal weight 175    - Tolerating Zepbound 10mg  weekly well, will increase to 12.5mg  weekly   - tirzepatide (weight loss) (ZEPBOUND) 12.5 mg/0.5 mL injection PEN; Inject 0.5 mL under the skin every 7 days.  Dispense: 2 mL; Refill: 2  - Recommend he continue to track protein and calorie intake given the amount of strenuous exercise he participates in   - Continue to rotate injection site, he can trial topical Benadryl or hydrocortisone cream to reaction site, notify clinic if it worsens  - RTC in 3 months for follow-up, telehealth ok  Wt Readings from Last 5 Encounters:  11/25/23 : 92.1 kg (203 lb)  09/24/23 : 98.9 kg (218 lb)  08/24/23 : 94.3 kg (208 lb)  07/10/23 : 97.5 kg (215 lb)  06/25/23 : 95.3 kg (210 lb)    Gastroesophageal reflux disease, unspecified whether esophagitis present  - He is wondering if Prilosec would be covered by insurance, ok to continue for GERD symptoms associated with Zepbound   - omeprazole DR (PRILOSEC) 40 mg capsule; Take one capsule by mouth daily before breakfast.  Dispense: 90 capsule; Refill: 1       No future appointments.  Patient Instructions   It was nice to see you today! Thank you for coming into our clinic.    Please notify the clinic or go to the emergency room if you develop sudden worsening of symptoms, fevers, chest pain, difficulty breathing, or other concerning symptoms.     Please call my nurse if you have any further questions or concerns, at [phone]. You can leave a voicemail if needed. You can also reach [place] by sending a secure message through MyChart.    Helpful Information:  We will contact you by MyChart or phone with any test results when they are available.   If you are expecting results and have not heard from [place] within 2 weeks of your testing, please send a MyChart message or call the clinic.  Please use the MyChart Refill request or contact your pharmacy directly to request medication refills. - Please allow at least 72 hours for refill requests.  For appointments: Our Scheduling phone number is [phone].   If you were expecting a phone call to schedule a referral and haven't heard from the referring office within two weeks, please let us know.    Take care,  Ailene Ravel, DNP, APRN, FNP-BC      No future appointments.     Steward Ros, APRN-NP

## 2023-11-26 ENCOUNTER — Encounter: Admit: 2023-11-26 | Discharge: 2023-11-26 | Payer: PRIVATE HEALTH INSURANCE

## 2023-11-27 ENCOUNTER — Encounter: Admit: 2023-11-27 | Discharge: 2023-11-27 | Payer: PRIVATE HEALTH INSURANCE

## 2023-12-01 ENCOUNTER — Encounter: Admit: 2023-12-01 | Discharge: 2023-12-01 | Payer: PRIVATE HEALTH INSURANCE

## 2023-12-02 ENCOUNTER — Encounter: Admit: 2023-12-02 | Discharge: 2023-12-02 | Payer: PRIVATE HEALTH INSURANCE

## 2023-12-03 ENCOUNTER — Encounter: Admit: 2023-12-03 | Discharge: 2023-12-03 | Payer: PRIVATE HEALTH INSURANCE

## 2023-12-12 ENCOUNTER — Encounter: Admit: 2023-12-12 | Discharge: 2023-12-12 | Payer: PRIVATE HEALTH INSURANCE

## 2023-12-29 ENCOUNTER — Encounter: Admit: 2023-12-29 | Discharge: 2023-12-29 | Payer: PRIVATE HEALTH INSURANCE

## 2023-12-31 ENCOUNTER — Encounter: Admit: 2023-12-31 | Discharge: 2023-12-31 | Payer: 59

## 2023-12-31 DIAGNOSIS — Z8669 Personal history of other diseases of the nervous system and sense organs: Secondary | ICD-10-CM

## 2023-12-31 DIAGNOSIS — E663 Overweight: Secondary | ICD-10-CM

## 2023-12-31 MED ORDER — ZEPBOUND 15 MG/0.5 ML SC PNIJ
15 mg | SUBCUTANEOUS | 2 refills | Status: AC
Start: 2023-12-31 — End: ?
  Filled 2024-01-02: qty 2, 28d supply, fill #1

## 2024-01-02 ENCOUNTER — Encounter: Admit: 2024-01-02 | Discharge: 2024-01-02 | Payer: PRIVATE HEALTH INSURANCE

## 2024-01-02 ENCOUNTER — Ambulatory Visit: Admit: 2024-01-02 | Discharge: 2024-01-03

## 2024-01-02 MED ORDER — AMOXICILLIN-POT CLAVULANATE 875-125 MG PO TAB
1 | ORAL_TABLET | Freq: Two times a day (BID) | ORAL | 0 refills | 7.00000 days | Status: AC
Start: 2024-01-02 — End: ?

## 2024-01-02 NOTE — Patient Instructions
Sinusitis (Antibiotic Treatment)    The sinuses are air-filled spaces within the bones of the face. They connect to the inside of the nose. Sinusitis is an inflammation of the tissue that lines the sinuses. Sinusitis can occur during a cold. It can also happen due to allergies to pollens and other particles in the air. Sinusitis can cause symptoms of sinus congestion and a feeling of fullness. A sinus infection causes fever, headache, and facial pain. There is often green or yellow fluid draining from the nose or into the back of the throat (post-nasal drip). You have been given antibiotics to treat this condition.   Home care  Take the full course of antibiotics as instructed. Don't stop taking them, even when you feel better.  Drink plenty of water, hot tea, and other liquids as directed by the healthcare provider. This may help thin nasal mucus. It also may help your sinuses drain fluids.  Heat may help soothe painful areas of your face. Use a towel soaked in hot water. Or, stand in the shower and direct the warm spray onto your face. Using a vaporizer along with a menthol rub at night may also help soothe symptoms.   An expectorant with guaifenesin may help thin nasal mucus and help your sinuses drain fluids. Talk with your provider or pharmacists before taking an over-the-counter (OTC) medicine if you have any questions about it or its side effects..  You can use an OTC decongestant, unless a similar medicine was prescribed to you. Nasal sprays work the fastest. Use one that contains phenylephrine or oxymetazoline. First blow your nose gently. Then use the spray. Don't use these medicines more often than directed on the label. If you do, your symptoms may get worse. You may also take pills that contain pseudoephedrine. Don't use products that combine multiple medicines. This is because side effects may be increased. Read labels. You can also ask the pharmacist for help. (People with high blood pressure should  not use decongestants. They can raise blood pressure.) Talk with your provider or pharmacist if you have any questions about the medicine..  OTC antihistamines may help if allergies contributed to your sinusitis. Talk with your provider or pharmacist if you have any questions about the medicine.  Nasal rinses or irrigation may help symptoms. It's very important to use these products only as directed. Use sterile water or sterile saline solution and not tap water. Tap water may contain germs that can cause infection in the brain. Don't rinse with excess pressure. this may spread the infection to other areas in your sinuses or head. Ask your healthcare provider or pharmacist if you have questions about using these products.  Use acetaminophen, naproxen, or ibuprofen to control pain, unless another pain medicine was prescribed to you. Talk with your healthcare provider before using these medicines if you have chronic liver or kidney disease or ever had a stomach ulcer. Never give aspirin to anyone under age 18 without first talking to their healthcare provider. It may cause severe liver damage.  Don't smoke. This can make symptoms worse.     Follow-up care  Follow up with your healthcare provider as advised.   When to seek medical advice  Call your healthcare provider if any of these occur:   Facial pain or headache that gets worse  Symptoms don't go away in 10 days  Fever of 100.4F (38C) or higher, or as directed by your healthcare provider  Call 911  Call 911 if any of   these occur:   Seizure  Trouble breathing  Feeling dizzy or faint  Fingernails, skin, or lips look blue, purple, or gray  Severe headache that doesn't go away  Stiff neck  Unusual drowsiness or confusion  Vision problems such as blurred or double vision  Swelling of your forehead or eyelids  Prevention  Here are steps you can take to help prevent an infection:   Keep good hand washing habits.  Don't have close contact with people who have sore  throats, colds, or other upper respiratory infections.  Don't smoke, and stay away from secondhand smoke.  Stay up to date with all of your vaccines.  StayWell last reviewed this educational content on 11/04/2020

## 2024-01-02 NOTE — Progress Notes
 Date of Service: 01/02/2024    Frank Parrish is a 33 y.o. male.  DOB: December 02, 1990  MRN: [id number]     Subjective:             Chief Complaint   Patient presents with    Sinus Infection     Sinus pressure started about 3 days ago and had a cold previously before pressure started. PT states having a sinus infection before and it feels like that. OTC Nyquil lastnight but nothing else.       History of Present Illness    Patient is a 33 year old male here for an urgent care visit.  He started getting ill about 14 days ago with cold symptoms. His cold symptoms have resolved, but 3 days ago he developed sinus pain and pressure.  He feels like he is getting worse.  He has a history of sinus infections.  He has taken DayQuil and NyQuil for symptoms.      Audie Clear Dalia Heading has a past medical history of Allergy (1992) (seasonal allergies), Anxiety disorder (2017), Obstructive sleep apnea (03/05/2019), and Seasonal allergic reaction.    He has a past surgical history that includes Spine surgery (2013) (microdiscectomy); tonsillectomy (2012); and Ankle surgery (Left) (2018).    Justino's family history includes Heart valvular in his mother; Hypertension in his father; Mental Illness in his father, mother, paternal aunt, paternal uncle, and sister.        Review of Systems   Constitutional:  Negative for fever.   HENT:  Positive for sinus pressure and sinus pain.          Objective:          amoxicillin-potassium clavulanate (AUGMENTIN) 875/125 mg tablet Take one tablet by mouth twice daily with meals for 7 days.    amphetamine-dextroamphetamine XR (ADDERALL XR) 30 mg capsule Take one capsule by mouth every morning. Indications: attention deficit disorder with hyperactivity    cetirizine (ZYRTEC) 10 mg tablet Take one tablet by mouth daily.    cloNIDine HCL (CATAPRES) 0.3 mg tablet Take one tablet by mouth daily.    dextroamphetamine-amphetamine (ADDERALL) 10 mg tablet Take one tablet by mouth twice daily. Indications: attention deficit disorder with hyperactivity    omeprazole DR (PRILOSEC) 40 mg capsule Take one capsule by mouth daily before breakfast.    tirzepatide (weight loss) (ZEPBOUND) 15 mg/0.5 mL injection PEN Inject 0.5 mL under the skin every 7 days.    venlafaxine XR (EFFEXOR XR) 75 mg capsule Take one capsule by mouth daily. Indications: repeated episodes of anxiety     Vitals:    01/02/24 1006   BP: (!) 127/92   BP Source: Arm, Left Upper   Pulse: 69   Temp: 98.3 ?F (36.8 ?C)   Resp: 18   SpO2: 97%   TempSrc: Skin   PainSc: Zero   Weight: 95.1 kg (209 lb 9.6 oz)   Height: 182.9 cm (6')     Body mass index is 28.43 kg/m?Marland Kitchen     Physical Exam  Vitals and nursing note reviewed.   Constitutional:       General: He is not in acute distress.     Appearance: He is well-developed. He is not ill-appearing, toxic-appearing or diaphoretic.   HENT:      Head: Normocephalic.      Right Ear: Tympanic membrane, ear canal and external ear normal.      Left Ear: Tympanic membrane, ear canal and external ear normal.  Nose:      Left Sinus: Maxillary sinus tenderness and frontal sinus tenderness present.      Mouth/Throat:      Mouth: Mucous membranes are moist.      Pharynx: Uvula midline. No oropharyngeal exudate or posterior oropharyngeal erythema.      Tonsils: No tonsillar exudate. 0 on the right. 0 on the left.   Eyes:      General: Lids are normal.      Extraocular Movements: Extraocular movements intact.      Conjunctiva/sclera: Conjunctivae normal.   Cardiovascular:      Rate and Rhythm: Normal rate and regular rhythm.      Heart sounds: Normal heart sounds, S1 normal and S2 normal.   Pulmonary:      Effort: Pulmonary effort is normal. No accessory muscle usage, prolonged expiration or respiratory distress.      Breath sounds: Normal breath sounds.   Musculoskeletal:      Cervical back: Neck supple.   Lymphadenopathy:      Cervical: No cervical adenopathy.   Skin:     General: Skin is warm and dry. Neurological:      Mental Status: He is alert and oriented to person, place, and time.   Psychiatric:         Attention and Perception: Attention normal.         Mood and Affect: Mood normal.         Speech: Speech normal.         Behavior: Behavior normal.         Thought Content: Thought content normal.         Cognition and Memory: Cognition normal.         Judgment: Judgment normal.              Assessment and Plan:  .Suszanne Conners. Ouch was seen today for sinus infection.    Diagnoses and all orders for this visit:    Acute bacterial sinusitis  -     amoxicillin-potassium clavulanate (AUGMENTIN) 875/125 mg tablet; Take one tablet by mouth twice daily with meals for 7 days.       Patient Instructions   Sinusitis (Antibiotic Treatment)  The sinuses are air-filled spaces within the bones of the face. They connect to the inside of the nose. Sinusitis is an inflammation of the tissue that lines the sinuses. Sinusitis can occur during a cold. It can also happen due to allergies to pollens and other particles in the air. Sinusitis can cause symptoms of sinus congestion and a feeling of fullness. A sinus infection causes fever, headache, and facial pain. There is often green or yellow fluid draining from the nose or into the back of the throat (post-nasal drip). You have been given antibiotics to treat this condition.     Home care  Take the full course of antibiotics as instructed. Don't stop taking them, even when you feel better.  Drink plenty of water, hot tea, and other liquids as directed by the healthcare provider. This may help thin nasal mucus. It also may help your sinuses drain fluids.  Heat may help soothe painful areas of your face. Use a towel soaked in hot water. Or, stand in the shower and direct the warm spray onto your face. Using a vaporizer along with a menthol rub at night may also help soothe symptoms.   An expectorant with guaifenesin may help thin nasal mucus and help your sinuses drain fluids. Talk with your  provider or pharmacists before taking an over-the-counter (OTC) medicine if you have any questions about it or its side effects..  You can use an OTC decongestant, unless a similar medicine was prescribed to you. Nasal sprays work the fastest. Use one that contains phenylephrine or oxymetazoline. First blow your nose gently. Then use the spray. Don't use these medicines more often than directed on the label. If you do, your symptoms may get worse. You may also take pills that contain pseudoephedrine. Don?t use products that combine multiple medicines. This is because side effects may be increased. Read labels. You can also ask the pharmacist for help. (People with high blood pressure should not use decongestants. They can raise blood pressure.) Talk with your provider or pharmacist if you have any questions about the medicine..  OTC antihistamines may help if allergies contributed to your sinusitis. Talk with your provider or pharmacist if you have any questions about the medicine.  Nasal rinses or irrigation may help symptoms. It's very important to use these products only as directed. Use sterile water or sterile saline solution and not tap water. Tap water may contain germs that can cause infection in the brain. Don't rinse with excess pressure. this may spread the infection to other areas in your sinuses or head. Ask your healthcare provider or pharmacist if you have questions about using these products.  Use acetaminophen, naproxen, or ibuprofen to control pain, unless another pain medicine was prescribed to you. Talk with your healthcare provider before using these medicines if you have chronic liver or kidney disease or ever had a stomach ulcer. Never give aspirin to anyone under age 76 without first talking to their healthcare provider. It may cause severe liver damage.  Don't smoke. This can make symptoms worse.     Follow-up care  Follow up with your healthcare provider as advised.   When to seek medical advice  Call your healthcare provider if any of these occur:   Facial pain or headache that gets worse  Symptoms don't go away in 10 days  Fever of 100.4?F (38?C) or higher, or as directed by your healthcare provider  Call 911  Call 911 if any of these occur:   Seizure  Trouble breathing  Feeling dizzy or faint  Fingernails, skin, or lips look blue, purple, or gray  Severe headache that doesn't go away  Stiff neck  Unusual drowsiness or confusion  Vision problems such as blurred or double vision  Swelling of your forehead or eyelids  Prevention  Here are steps you can take to help prevent an infection:   Keep good hand washing habits.  Don?t have close contact with people who have sore throats, colds, or other upper respiratory infections.  Don?t smoke, and stay away from secondhand smoke.  Stay up to date with all of your vaccines.  StayWell last reviewed this educational content on 11/04/2020

## 2024-01-03 DIAGNOSIS — B9689 Other specified bacterial agents as the cause of diseases classified elsewhere: Secondary | ICD-10-CM

## 2024-01-03 DIAGNOSIS — J019 Acute sinusitis, unspecified: Secondary | ICD-10-CM

## 2024-01-13 ENCOUNTER — Encounter: Admit: 2024-01-13 | Discharge: 2024-01-13

## 2024-01-15 ENCOUNTER — Encounter: Admit: 2024-01-15 | Discharge: 2024-01-15

## 2024-01-15 MED FILL — OMEPRAZOLE 40 MG PO CPDR: 40 mg | ORAL | 30 days supply | Qty: 30 | Fill #2 | Status: CP

## 2024-01-25 ENCOUNTER — Encounter: Admit: 2024-01-25 | Discharge: 2024-01-25 | Payer: PRIVATE HEALTH INSURANCE

## 2024-01-25 NOTE — Telephone Encounter
 Person Calling (Patient, Partner, Friend, etc.): Frank Parrish  Permission to Communicate on File Yes  Frank Parrish / 03/02/1991    Chief Complaint   Patient presents with    Sinus Infection     Finished lats round of abx Thursday for same issue        Onset of Symptoms: See below    Summary: Pt reports he finished last dosage of most recent round of antibiotics on Thursday 3/20. Pt reports symptoms resolved, but woke up this AM with gross taste in my mouth like I had with my sinus infection and is expressing concern that sinus infection has come back. Pt reports this was the initial symptom he experienced with prior sinus infections before sinus pain/pressure began. Pt was treated with augmentin for sinus infection on 2/28 and then again on 3/11 at 2 separate urgent cares.     Disposition: See PCP within 3 days. Appointment scheduled for pt for tomorrow 3/24 at 1:40 pm. Pt agrees with plan.     Following Disposition: Y       augmentin  Reason for Disposition   [1] Taking antibiotic > 7 days AND [2] nasal discharge not improved    Answer Assessment - Initial Assessment Questions  1. ANTIBIOTIC: What antibiotic are you taking? How many times a day?      Augmentin, finished taking on 3/20  2. ONSET: When was the antibiotic started?      See note  3. PAIN: How bad is the sinus pain?   (Scale 1-10; mild, moderate or severe)      Denies at this time  4. FEVER: Do you have a fever? If Yes, ask: What is it, how was it measured, and when did it start?       denies  5. SYMPTOMS: Are there any other symptoms you're concerned about? If Yes, ask: When did it start?      Bacterial bad taste in mouth starting today  6. PREGNANCY: Is there any Parrish you are pregnant? When was your last menstrual period?      NA    Protocols used: Sinus Infection on Antibiotic Follow-up Call-[facility]

## 2024-01-26 ENCOUNTER — Ambulatory Visit: Admit: 2024-01-26 | Discharge: 2024-01-26 | Payer: PRIVATE HEALTH INSURANCE

## 2024-01-26 ENCOUNTER — Encounter: Admit: 2024-01-26 | Discharge: 2024-01-26 | Payer: PRIVATE HEALTH INSURANCE

## 2024-01-26 DIAGNOSIS — J329 Chronic sinusitis, unspecified: Secondary | ICD-10-CM

## 2024-01-26 DIAGNOSIS — J309 Allergic rhinitis, unspecified: Secondary | ICD-10-CM

## 2024-01-26 MED ORDER — LEVOFLOXACIN 750 MG PO TAB
750 mg | ORAL_TABLET | Freq: Every day | ORAL | 0 refills | 7.00000 days | Status: AC
Start: 2024-01-26 — End: ?

## 2024-01-26 MED ORDER — METHYLPREDNISOLONE 4 MG PO DSPK
ORAL_TABLET | 0 refills | Status: AC
Start: 2024-01-26 — End: ?

## 2024-01-26 NOTE — Patient Instructions
 Leman,  It was nice to see you today. Thank you for coming into clinic.    Routine Clinic Information:  Please don't hesitate to call if you have any problems or questions. My nurse Meriam Sprague can be reached at [phone]  If she does not answer, please leave a voicemail as she is probably rooming other patients.You may also message [place] in MyChart.    For refills on medications, please have your pharmacy fax a refill authorization request form to our office at Fax  [phone]. Please allow at least 3 business days for refill requests.     For urgent issues after business hours/weekends/holidays call [phone] and request for the outpatient internal medicine physician to be paged    We offer same day appointments for your acute health concerns. These appointments are on a first come, first serve basis. Please call [phone] if you would like to make an appointment.         To schedule appointment with specialist and/or testing please lease call central scheduling number at [phone]    No follow-ups on file.    Malachi Paradise, MD  Internal Medicine    1. Recurrent sinus infections    2. Allergic sinusitis        Orders Placed This Encounter    SINUSES COMPLETE MIN 3 VIEWS    ENT    levoFLOXacin (LEVAQUIN) 750 mg tablet    methylPREDNIsolone (MEDROL (PAK)) 4 mg tablet

## 2024-01-26 NOTE — Progress Notes
 Date of Service: 01/26/2024    Frank Parrish is a 33 y.o. male. DOB: [birth year]   MRN#: [id number]    Subjective:     Chief Complaint   Patient presents with    Follow Up     sinus infection       History of Present Illness  33 year old pleasant male presented to clinic with concerns regarding recurrent sinus infections.  Patient reported in the last 6 weeks he was treated multiple times for sinus infections.  He has a history of allergic rhinitis but in the last 6 weeks he has been having purulent sputum production and cough and postnasal drainage as well as pain in the sinus area.  He was treated with Augmentin 2 times and he responded while on the medication though his symptoms came back quickly after he stopped the medication.  No associated chest pain or shortness of breath.  No fever.       ROS  See present illness.  Past Medical History:    Allergy    Anxiety disorder    Obstructive sleep apnea    Seasonal allergic reaction     Surgical History:   Procedure Laterality Date    ANKLE SURGERY Left     2018    HX TONSILLECTOMY  2012    SPINE SURGERY  2013    microdiscectomy     Family History   Problem Relation Name Age of Onset    Mental Illness Mother Aarav Burgett         ADHD, Anxiety    Heart valvular Mother Frutoso Dimare         bicuspid aortic valve    Hypertension Father Terrall Bley     Mental Illness Father Enio Hornback         ADHD, Anxiety    Mental Illness Sister [name]         ADHD, Anxiety, Depression    Mental Illness Paternal Aunt Javius Sylla         ADHD    Mental Illness Paternal Uncle [name]         ADHD     Social History     Tobacco Use   Smoking Status Never   Smokeless Tobacco Never        Objective:      amphetamine-dextroamphetamine XR (ADDERALL XR) 30 mg capsule Take one capsule by mouth every morning. Indications: attention deficit disorder with hyperactivity    cetirizine (ZYRTEC) 10 mg tablet Take one tablet by mouth daily.    cloNIDine HCL (CATAPRES) 0.3 mg tablet Take one tablet by mouth daily.    dextroamphetamine-amphetamine (ADDERALL) 10 mg tablet Take one tablet by mouth twice daily. Indications: attention deficit disorder with hyperactivity    levoFLOXacin (LEVAQUIN) 750 mg tablet Take one tablet by mouth daily for 7 days.    methylPREDNIsolone (MEDROL (PAK)) 4 mg tablet Take medication as directed on package for 6 days. Take with food.    omeprazole DR (PRILOSEC) 40 mg capsule Take one capsule by mouth daily before breakfast.    tirzepatide (weight loss) (ZEPBOUND) 15 mg/0.5 mL injection PEN Inject 0.5 mL under the skin every 7 days.    venlafaxine XR (EFFEXOR XR) 75 mg capsule Take one capsule by mouth daily. Indications: repeated episodes of anxiety     Vitals:    01/26/24 1352   BP: 110/82   BP Source: Arm, Left Upper   Pulse: 68   Temp: 36.4 ?C (97.5 ?  F)   Resp: 14   SpO2: 100%   TempSrc: Oral   PainSc: Zero   Weight: 89.3 kg (196 lb 12.8 oz)   Height: 182.9 cm (6')     Body mass index is 26.69 kg/m?Marland Kitchen     Physical Exam  Constitutional:       Appearance: Normal appearance. He is not toxic-appearing.   HENT:      Head: Normocephalic and atraumatic.      Right Ear: Tympanic membrane normal.      Left Ear: Tympanic membrane normal.      Nose: Congestion present.      Right Sinus: Maxillary sinus tenderness present.      Left Sinus: Maxillary sinus tenderness present.      Mouth/Throat:      Mouth: Mucous membranes are moist.   Cardiovascular:      Rate and Rhythm: Normal rate and regular rhythm.   Pulmonary:      Effort: Pulmonary effort is normal. No respiratory distress.      Breath sounds: No wheezing.   Neurological:      General: No focal deficit present.      Mental Status: He is alert and oriented to person, place, and time.   Psychiatric:         Mood and Affect: Mood normal.         Behavior: Behavior normal.              Patient Active Problem List    Diagnosis Date Noted    Pure hypercholesterolemia 07/10/2023    Family history of bicuspid aortic valve 07/10/2023    Excessive daytime sleepiness 05/19/2023    Intolerance of continuous positive airway pressure (CPAP) ventilation 11/14/2022    History of sleep apnea 11/14/2022    Positional sleep apnea 07/17/2022    Overweight 07/17/2022    Psychophysiological insomnia 07/17/2022    Lumbar spondylosis 05/18/2020    Concentration deficit 09/22/2018    Bone cyst of left tibia 02/18/2018     Formatting of this note might be different from the original.  Added automatically from request for surgery [id number]      Left ankle instability 02/18/2018     Formatting of this note might be different from the original.  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  Added automatically from request for surgery [id number]      Generalized anxiety disorder 10/04/2017    Knee pain, right 03/13/2015    Left ankle pain 03/13/2015    ADHD (attention deficit hyperactivity disorder), combined type 07/05/1996       Assessment and Plan:    Devesh Monforte. Chismar was seen today for follow up.    Diagnoses and all orders for this visit:    Recurrent sinus infections  -     levoFLOXacin (LEVAQUIN) 750 mg tablet; Take one tablet by mouth daily for 7 days.  -     methylPREDNIsolone (MEDROL (PAK)) 4 mg tablet; Take medication as directed on package for 6 days. Take with food.  -     SINUSES COMPLETE MIN 3 VIEWS; Future; Expected date: 01/26/2024  -     AMB REFERRAL TO ENT    Allergic sinusitis  -     methylPREDNIsolone (MEDROL (PAK)) 4 mg tablet; Take medication as directed on package for 6 days. Take with food.  Return if symptoms worsen or fail to improve.

## 2024-01-27 ENCOUNTER — Encounter: Admit: 2024-01-27 | Discharge: 2024-01-27 | Payer: PRIVATE HEALTH INSURANCE

## 2024-02-01 ENCOUNTER — Encounter: Admit: 2024-02-01 | Discharge: 2024-02-01

## 2024-02-02 ENCOUNTER — Encounter: Admit: 2024-02-02 | Discharge: 2024-02-02

## 2024-02-03 ENCOUNTER — Encounter: Admit: 2024-02-03 | Discharge: 2024-02-03

## 2024-02-04 MED FILL — ZEPBOUND 15 MG/0.5 ML SC PNIJ: 15 mg/0.5 mL | SUBCUTANEOUS | 28 days supply | Qty: 2 | Fill #2 | Status: AC

## 2024-02-16 ENCOUNTER — Encounter: Admit: 2024-02-16 | Discharge: 2024-02-16 | Payer: PRIVATE HEALTH INSURANCE

## 2024-02-16 MED FILL — OMEPRAZOLE 40 MG PO CPDR: 40 mg | ORAL | 30 days supply | Qty: 30 | Fill #3 | Status: AC

## 2024-02-17 ENCOUNTER — Encounter: Admit: 2024-02-17 | Discharge: 2024-02-17 | Payer: PRIVATE HEALTH INSURANCE

## 2024-02-17 ENCOUNTER — Ambulatory Visit: Admit: 2024-02-17 | Discharge: 2024-02-18 | Payer: PRIVATE HEALTH INSURANCE

## 2024-02-17 NOTE — Progress Notes
 Clinical Nutrition Assessment Summary    Frank Parrish is a 33 y.o. male with referral for wt management  Past Medical History:   Patient Active Problem List    Diagnosis Date Noted    Pure hypercholesterolemia 07/10/2023    Family history of bicuspid aortic valve 07/10/2023    Excessive daytime sleepiness 05/19/2023    Intolerance of continuous positive airway pressure (CPAP) ventilation 11/14/2022    History of sleep apnea 11/14/2022    Positional sleep apnea 07/17/2022    Overweight 07/17/2022    Psychophysiological insomnia 07/17/2022    Lumbar spondylosis 05/18/2020    Concentration deficit 09/22/2018    Bone cyst of left tibia 02/18/2018    Left ankle instability 02/18/2018    Osteochondral defect 02/18/2018    Generalized anxiety disorder 10/04/2017    Knee pain, right 03/13/2015    Left ankle pain 03/13/2015    ADHD (attention deficit hyperactivity disorder), combined type 07/05/1996     Current Treatment Plan: Wt management nutrition therapy    Nutrition Assessment of Patient:  Wt Readings from Last 10 Encounters:   01/26/24 89.3 kg (196 lb 12.8 oz)   01/02/24 95.1 kg (209 lb 9.6 oz)   11/25/23 92.1 kg (203 lb)   09/24/23 98.9 kg (218 lb)   08/24/23 94.3 kg (208 lb)   07/10/23 97.5 kg (215 lb)   06/25/23 95.3 kg (210 lb)   05/19/23 97.5 kg (215 lb)   11/11/22 96.2 kg (212 lb)   11/01/22 89.4 kg (197 lb)      Pertinent Labs:   Lab Results   Component Value Date/Time    HGB 15.7 09/24/2023 08:03 AM    HCT 46.6 09/24/2023 08:03 AM    NA 139 09/24/2023 08:03 AM    K 4.8 09/24/2023 08:03 AM    CR 1.10 09/24/2023 08:03 AM    HGBA1C 6.0 (H) 09/24/2023 08:03 AM    GLU 90 09/24/2023 08:03 AM    CHOL 207 (H) 09/24/2023 08:03 AM    TRIG 110 09/24/2023 08:03 AM    HDL 48 09/24/2023 08:03 AM    LDL 149 (H) 09/24/2023 08:03 AM    VITD25 40 09/24/2023 08:03 AM         Pertinent Meds/Supplements:   Current Outpatient Medications   Medication Instructions amphetamine-dextroamphetamine XR (ADDERALL XR) 30 mg capsule 30 mg, Oral, EVERY MORNING    cetirizine (ZYRTEC) 10 mg tablet 1 tablet, DAILY    cloNIDine HCL (CATAPRES) 0.3 mg, Oral, DAILY    dextroamphetamine-amphetamine (ADDERALL) 10 mg tablet 10 mg, TWICE DAILY    methylPREDNIsolone (MEDROL (PAK)) 4 mg tablet Take medication as directed on package for 6 days. Take with food.    omeprazole DR (PRILOSEC) 40 mg, Oral, DAILY BEFORE BREAKFAST    venlafaxine XR (EFFEXOR XR) 75 mg, Oral, DAILY    ZEPBOUND 15 mg, Subcutaneous, EVERY  7 DAYS       Pertinent Food Allergies/Intolerance: no    Pt reports saw a dietitian 2 years ago for weight loss and through nutrition changes and use of the noom app saw weight loss. Pt reports weight trended back up d/t increased appetite. 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 Pt reports has desire for long term sustainable nutrition. Pt reports feels hungry by the end of the day and would like to feel more full.    Food & Activity recall  Breakfast: 260g greek yogurt, 20g honey, latte with 2% milk OR with  toast if running   Snacks: fruit or protein bar/shake (100-200 kcals), or peanut butter  Lunch: 1/4 C rice, 1/2 C chicken, broccoli/cauliflower, siracha   Snacks: apple, toast with PB  Dinner: chicken red curry rice noodles and veggies (540 kcal)  Snacks: ice cream (1 cup), cashews, veggie straws   Kcals: 2198 kcals   Protein: 134g    Favorite fruits & vegetables:  Physical activity: 5 days running (6 hours running per week)    Intervention/Plan:  Discussion:  Current eating patterns- good things to continue and spots for improvement  Mediterranean eating style   Maximizing fruits & veggies to >5 cups/day    Goals:    limit high calories/low nutrient foods such as fried foods, suagry drinks, excessive sweets  Mediterranean eating style  Maximize veggies on plate and include fruit - goal of 5 cups fruits + veggies/day  Track calories on an app if helpful MyFitnessPal, Cronometer, Nutritionix  Include plant based proteins like lentils, beans, tofu  Include protein at every meal  Freeze meals to allow for easy on the go options   Plan snack choices: try pre portioned with carrots and humus, nuts, popcorn  Continue running     Nutrition Monitoring and Evaluation:  Goal: Healthy wt loss     The patient was allowed to ask questions and actively participated in creating plan of care. I provided the patient with my contact information and instructed pt on how to get in touch with me if questions or concerns arise. Thank you for allowing nutrition services to participate in this patients care.     Follow up Date: August     Jolee Naval, RD, LD

## 2024-02-18 DIAGNOSIS — E663 Overweight: Secondary | ICD-10-CM

## 2024-03-03 ENCOUNTER — Encounter: Admit: 2024-03-03 | Discharge: 2024-03-03 | Payer: PRIVATE HEALTH INSURANCE

## 2024-03-03 MED FILL — ZEPBOUND 15 MG/0.5 ML SC PNIJ: 15 mg/0.5 mL | SUBCUTANEOUS | 28 days supply | Qty: 2 | Fill #3 | Status: CP

## 2024-03-09 ENCOUNTER — Encounter: Admit: 2024-03-09 | Discharge: 2024-03-09 | Payer: PRIVATE HEALTH INSURANCE

## 2024-03-09 ENCOUNTER — Ambulatory Visit: Admit: 2024-03-09 | Discharge: 2024-03-10 | Payer: PRIVATE HEALTH INSURANCE

## 2024-03-09 DIAGNOSIS — J0191 Acute recurrent sinusitis, unspecified: Secondary | ICD-10-CM

## 2024-03-09 NOTE — Progress Notes
 Date of Service: 03/09/2024    Frank Parrish is a 33 y.o. male. DOB: [birth year]   MRN#: [id number]    Subjective:       History of Present Illness  Patient was referred by Cristela Donald, MD and presents today with CC of recurrent sinus infections. Onset was over the last 6-9 months. Patient describes having a sinus infection every time he gets a cold. He reports his most recent course of levo and steroids resolved his most recent infection. He uses nasal sinus rinses twice daily as needed when ill. Flonase as needed and zyrtec, he does not feel he gets severe seasonal allergies. Denies poor nasal breathing, sinus pain, sinus pressure, fevers, chills, hx of sinus surgery.                   Objective:     Past Medical History:    Allergy    Anxiety disorder    Obstructive sleep apnea    Seasonal allergic reaction       Surgical History:   Procedure Laterality Date    ANKLE SURGERY Left     2018    HX EAR TUBES  1996    HX TONSILLECTOMY  2012    SPINE SURGERY  2013    microdiscectomy        amphetamine -dextroamphetamine  XR (ADDERALL XR) 30 mg capsule Take one capsule by mouth every morning. Indications: attention deficit disorder with hyperactivity    cetirizine (ZYRTEC) 10 mg tablet Take one tablet by mouth daily.    cloNIDine  HCL (CATAPRES ) 0.3 mg tablet Take one tablet by mouth daily.    dextroamphetamine -amphetamine  (ADDERALL) 10 mg tablet Take one tablet by mouth twice daily. Indications: attention deficit disorder with hyperactivity    omeprazole  DR (PRILOSEC) 40 mg capsule Take one capsule by mouth daily before breakfast.    tirzepatide  (weight loss) (ZEPBOUND ) 15 mg/0.5 mL injection PEN Inject 0.5 mL under the skin every 7 days.    venlafaxine  XR (EFFEXOR  XR) 75 mg capsule Take one capsule by mouth daily. Indications: repeated episodes of anxiety     Vitals:    03/09/24 1303   BP: 96/75   BP Source: Arm, Left Upper   Pulse: 67   Temp: 36.7 ?C (98.1 ?F)   Weight: 92 kg (202 lb 12.8 oz)   Height: 182.9 cm (6')     Body mass index is 27.5 kg/m?Aaron Aas     Physical Exam  General:  The patient is awake and alert no acute distress  SKIN:  Skin examination was unremarkable no mass or lesion appreciated no evidence of cellulitis.  No evidence of skin cancers.    EYES: Extraocular muscle mobility is intact.  No conjunctival hemorrhage.   EARS:The auricles were without deformity.  External auditory canals are clear no evidence of cerumen fungus or bacterial infection.  Tympanic membranes are without effusion or retraction.  No evidence of perforation.  No cholesteatoma.    NOSE: The nasal airway shows a straight septum without evidence of perforation or significant crusting.  There are no evidence of polypoid changes.  The inferior turbinate is not congested.  There are no active bleeding sites.    ORAL CAVITY: The oral cavity shows buccal surfaces are without evidence of lichenoid changes or mucosal disease.  No leukoplakia.  The floor of mouth is without edema.  Wharton's ducts and Stensen's ducts are patent with normal salivary flow.  The tongue is without mass or lesion. There is normal  mobility and sensation.    OROPHARYNX: The oropharynx shows normal mucosa.   No significant postnasal drainage.  Uvula is without edema.  NECK: Neck was flat no adenopathy or thyromegaly.  No parotid masses or submandibular gland masses.    NEURO: Cranial nerves are intact bilaterally.  Voice quality is excellent.   PULMONARY:  No airway distress.  No stridor.  No retractions.               Assessment and Plan:  1. Acute recurrent sinusitis, unspecified location            Impression/Plan: patient reports he is doing well now after being treated with levo and steroids. He feels his issue is resolved now. Discussed maintaining a regular nasal sinus regimen including nasal sinus rinses daily, nasal saline sprays as needed, flonase daily, and OTC antihistamine daily. Patient agreeable to plan, may increase frequency of nasal sinus rinses and saline sprays as needed.     Recommended patient see PCP or urgent care for acute sinus infections and if initial abx treatment is unsuccessful will follow up with ENT for scope and culture.       Follow up: as needed    Total time 30 minutes, with 15 minutes spent face to face with patient.  Total time reflects time spent on the date of visit Preparing to see the patient, Obtaining/Reviewing history, Performing exam, Counseling/Educating the patient/family/caregiver, Communicating with other health care professions , Documenting clinical information, and Coordinating care.     ATTESTATION  This visit was documented by Rolm Clos via audio recorded by Maren Shank, APRN-NP, on 03/09/2024 1:44 PM.       Maren Shank, APRN-NP  Department of Otolaryngology, AGACNP-BC    Phone # [phone]  Fax # [phone]  Email [email or web address]

## 2024-03-10 DIAGNOSIS — J329 Chronic sinusitis, unspecified: Secondary | ICD-10-CM

## 2024-03-15 ENCOUNTER — Encounter: Admit: 2024-03-15 | Discharge: 2024-03-15 | Payer: PRIVATE HEALTH INSURANCE

## 2024-03-22 ENCOUNTER — Encounter: Admit: 2024-03-22 | Discharge: 2024-03-22 | Payer: PRIVATE HEALTH INSURANCE

## 2024-03-22 DIAGNOSIS — E663 Overweight: Secondary | ICD-10-CM

## 2024-03-22 DIAGNOSIS — Z8669 Personal history of other diseases of the nervous system and sense organs: Secondary | ICD-10-CM

## 2024-03-22 MED ORDER — ZEPBOUND 15 MG/0.5 ML SC PNIJ
15 mg | SUBCUTANEOUS | 2 refills | 28.00000 days | Status: AC
Start: 2024-03-22 — End: ?
  Filled 2024-03-25: qty 2, 28d supply, fill #1

## 2024-03-22 MED FILL — OMEPRAZOLE 40 MG PO CPDR: 40 mg | ORAL | 30 days supply | Qty: 30 | Fill #4 | Status: CP

## 2024-03-22 NOTE — Telephone Encounter
 1. Received e-request from pharmacy requesting new Rx for   Requested Prescriptions     Pending Prescriptions Disp Refills    tirzepatide  (weight loss) (ZEPBOUND ) 15 mg/0.5 mL injection PEN 2 mL 2     Sig: Inject 0.5 mL under the skin every 7 days.   .    2. Last Rx written: 12/31/2023  (# 2 ml  x0) by Flanagin, danielle    3. Last Gen Med Visit: in person  on 01/26/2024    4. Next Appt due: Provider recommended return date from last office visit: no patient instructions     Future Appointments   Date Time Provider Department Center   06/15/2024 10:00 AM Behm, Iris, RD [facility]       5. All protocol criteria met? No, see below      Manual Review: This is a titrated medication, ensure correct dose.     Courtesy Refills: N/A    Patient due for an office visit in 1,2, or 3 months 90 day supply, 0 refills   Patient due for an office visit in 4,5, or 6 months 90 day supply, 1 refill   Patient due for an office visit in 7,8, or 9 months 90 day supply, 2 refills   Patient due for an office visit in 10,11, or 12 months 90 day supply, 3 refills

## 2024-03-23 ENCOUNTER — Encounter: Admit: 2024-03-23 | Discharge: 2024-03-23 | Payer: PRIVATE HEALTH INSURANCE

## 2024-03-24 ENCOUNTER — Encounter: Admit: 2024-03-24 | Discharge: 2024-03-24 | Payer: PRIVATE HEALTH INSURANCE

## 2024-03-24 NOTE — Progress Notes
 Pharmacy Benefits Investigation    Medication name: tirzepatide  (weight loss) (ZEPBOUND ) 15 mg/0.5 mL injection PEN  Medication status: continuation (refill)    The insurance requires a prior authorization for the medication. The prior authorization renewal was submitted via CoverMyMeds.    PA number: [id number]    Frank Parrish  Specialty Pharmacy Patient Advocate

## 2024-04-03 ENCOUNTER — Encounter: Admit: 2024-04-03 | Discharge: 2024-04-03 | Payer: PRIVATE HEALTH INSURANCE

## 2024-04-03 NOTE — Progress Notes
 Pharmacy Benefits Investigation    Medication name: tirzepatide  (weight loss) (ZEPBOUND ) 15 mg/0.5 mL injection PEN  Medication status: continuation (refill)    The prior authorization renewal was reapproved for [employer] (PA number [id number]) from 03/24/2024 through 03/24/2025.    The out of pocket cost today is $264.89 for 28 days. This cost may change due to factors including but not limited to changes in insurance coverage.    A copay card was obtained and will provide the patient with up to $150 per 28 days.    After assistance, the final out of pocket cost is $114.89.    The patient previously stated this copay is affordable. The patient is not due for a refill at this time, the prescription will be filled through the normal refill process.  Alexia Angelucci  Specialty Pharmacy Patient Advocate

## 2024-04-05 ENCOUNTER — Encounter: Admit: 2024-04-05 | Discharge: 2024-04-05 | Payer: PRIVATE HEALTH INSURANCE

## 2024-04-19 ENCOUNTER — Encounter: Admit: 2024-04-19 | Discharge: 2024-04-19 | Payer: PRIVATE HEALTH INSURANCE

## 2024-04-22 ENCOUNTER — Encounter: Admit: 2024-04-22 | Discharge: 2024-04-22 | Payer: PRIVATE HEALTH INSURANCE

## 2024-04-22 MED FILL — OMEPRAZOLE 40 MG PO CPDR: 40 mg | ORAL | 30 days supply | Qty: 30 | Fill #5 | Status: AC

## 2024-04-29 ENCOUNTER — Encounter: Admit: 2024-04-29 | Discharge: 2024-04-29 | Payer: PRIVATE HEALTH INSURANCE

## 2024-04-30 ENCOUNTER — Encounter: Admit: 2024-04-30 | Discharge: 2024-04-30 | Payer: PRIVATE HEALTH INSURANCE

## 2024-04-30 MED FILL — ZEPBOUND 15 MG/0.5 ML SC PNIJ: 15 | SUBCUTANEOUS | 28 days supply | Qty: 2 | Fill #1 | Status: CP

## 2024-05-13 ENCOUNTER — Encounter: Admit: 2024-05-13 | Discharge: 2024-05-13 | Payer: PRIVATE HEALTH INSURANCE

## 2024-06-15 ENCOUNTER — Encounter: Admit: 2024-06-15 | Discharge: 2024-06-15 | Payer: PRIVATE HEALTH INSURANCE

## 2024-06-18 ENCOUNTER — Encounter: Admit: 2024-06-18 | Discharge: 2024-06-18 | Payer: PRIVATE HEALTH INSURANCE

## 2024-06-23 ENCOUNTER — Encounter: Admit: 2024-06-23 | Discharge: 2024-06-23 | Payer: PRIVATE HEALTH INSURANCE

## 2024-06-24 ENCOUNTER — Encounter: Admit: 2024-06-24 | Discharge: 2024-06-24 | Payer: PRIVATE HEALTH INSURANCE

## 2024-06-24 MED FILL — OMEPRAZOLE 40 MG PO CPDR: 40 mg | ORAL | 30 days supply | Qty: 30 | Fill #6 | Status: AC

## 2024-07-14 ENCOUNTER — Encounter: Admit: 2024-07-14 | Discharge: 2024-07-14 | Payer: PRIVATE HEALTH INSURANCE

## 2024-07-14 ENCOUNTER — Ambulatory Visit: Admit: 2024-07-14 | Discharge: 2024-07-15 | Payer: PRIVATE HEALTH INSURANCE

## 2024-07-14 VITALS — BP 123/87 | HR 74 | Temp 97.80000°F | Resp 18 | Ht 72.0 in | Wt 207.0 lb

## 2024-07-14 DIAGNOSIS — J22 Unspecified acute lower respiratory infection: Principal | ICD-10-CM

## 2024-07-14 MED ORDER — BENZONATATE 200 MG PO CAP
200 mg | ORAL_CAPSULE | ORAL | 0 refills | 9.00000 days | Status: AC
Start: 2024-07-14 — End: ?

## 2024-07-14 MED ORDER — ALBUTEROL SULFATE 90 MCG/ACTUATION IN HFAA
2 | RESPIRATORY_TRACT | 0 refills | 25.00000 days | Status: AC | PRN
Start: 2024-07-14 — End: ?

## 2024-07-14 MED ORDER — PREDNISONE 20 MG PO TAB
40 mg | ORAL_TABLET | Freq: Every day | ORAL | 0 refills | 30.00000 days | Status: AC
Start: 2024-07-14 — End: ?

## 2024-07-14 MED ORDER — AZITHROMYCIN 250 MG PO TAB
ORAL_TABLET | ORAL | 0 refills | 5.00000 days | Status: AC
Start: 2024-07-14 — End: ?

## 2024-07-14 NOTE — Progress Notes
 Date of Service: 07/14/2024    Frank Parrish is a 33 y.o. male.  DOB: January 27, 1991  MRN: [id number]     Subjective:             History of Present Illness  Chief Complaint   Patient presents with    Cough     Deep wet cough x4 days    Congestion     X10 days   Patient reports he has has a cold for about 11 days and then he has been coughing for the last 4 days.  He has coughed up a little mucous.  He denies nasal congestion.  He reports some chest tightness.         Review of Systems   HENT:  Positive for congestion.    Respiratory:  Positive for cough and chest tightness.          Objective:          albuterol  sulfate (PROAIR  HFA) 90 mcg/actuation HFA aerosol inhaler Inhale two puffs by mouth into the lungs every 6 hours as needed.    amphetamine -dextroamphetamine  XR (ADDERALL XR) 30 mg capsule Take one capsule by mouth every morning. Indications: attention deficit disorder with hyperactivity    azithromycin  (ZITHROMAX  Z-PAK) 250 mg tablet Take two tablets by mouth daily for 1 day, THEN one tablet daily for 4 days.    benzonatate  (TESSALON ) 200 mg capsule Take one capsule by mouth every 8 hours.    cetirizine (ZYRTEC) 10 mg tablet Take one tablet by mouth daily.    cloNIDine  HCL (CATAPRES ) 0.3 mg tablet Take one tablet by mouth daily.    dextroamphetamine -amphetamine  (ADDERALL) 10 mg tablet Take one tablet by mouth twice daily. Indications: attention deficit disorder with hyperactivity    gabapentin (NEURONTIN) 300 mg capsule TAKE 2 TO 3 CAPSULES BY MOUTH EVERY DAY AT BEDTIME    omeprazole  DR (PRILOSEC) 40 mg capsule Take one capsule by mouth daily before breakfast.    predniSONE  (DELTASONE ) 20 mg tablet Take two tablets by mouth daily with breakfast for 5 days.    tirzepatide  (weight loss) (ZEPBOUND ) 15 mg/0.5 mL injection PEN Inject 0.5 mL under the skin every 7 days. (Patient not taking: Reported on 07/14/2024)    venlafaxine  XR (EFFEXOR  XR) 75 mg capsule Take one capsule by mouth daily. Indications: repeated episodes of anxiety     Vitals:    07/14/24 1018   BP: 123/87   BP Source: Arm, Right Upper   Pulse: 74   Temp: 97.8 ?F (36.6 ?C)   Resp: 18   SpO2: 100%   TempSrc: Oral   Weight: 93.9 kg (207 lb)   Height: 182.9 cm (6')     Body mass index is 28.07 kg/m?Frank Parrish     Physical Exam  Constitutional:       General: He is awake. He is not in acute distress.     Appearance: He is not ill-appearing, toxic-appearing or diaphoretic.   HENT:      Head: Normocephalic.      Right Ear: Hearing, tympanic membrane, ear canal and external ear normal.      Left Ear: Hearing, tympanic membrane, ear canal and external ear normal.      Nose:      Right Sinus: No maxillary sinus tenderness or frontal sinus tenderness.      Left Sinus: No maxillary sinus tenderness or frontal sinus tenderness.      Mouth/Throat:      Mouth: Mucous membranes are  dry.      Pharynx: No pharyngeal swelling, oropharyngeal exudate or posterior oropharyngeal erythema.      Comments: I encouraged patient to drink 8 cups of fluids daily.  Cardiovascular:      Rate and Rhythm: Normal rate and regular rhythm.      Heart sounds: Normal heart sounds.   Pulmonary:      Effort: Pulmonary effort is normal.      Breath sounds: Normal breath sounds and air entry. No decreased breath sounds, wheezing, rhonchi or rales.      Comments: Patient has a very deep cough.  Lymphadenopathy:      Head:      Right side of head: No tonsillar, preauricular, posterior auricular or occipital adenopathy.      Left side of head: No tonsillar, preauricular, posterior auricular or occipital adenopathy.      Cervical: Cervical adenopathy present.      Right cervical: Superficial cervical adenopathy and deep cervical adenopathy present.      Left cervical: Superficial cervical adenopathy and deep cervical adenopathy present.      Upper Body:      Right upper body: No supraclavicular adenopathy.      Left upper body: No supraclavicular adenopathy.   Neurological:      Mental Status: He is alert and oriented to person, place, and time.   Psychiatric:         Mood and Affect: Mood normal.         Behavior: Behavior normal. Behavior is cooperative.         Thought Content: Thought content normal.         Judgment: Judgment normal.              Assessment and Plan:  Frank Parrish was seen today for cough and congestion.    Diagnoses and all orders for this visit:    Acute lower respiratory infection    Other orders  -     azithromycin  (ZITHROMAX  Z-PAK) 250 mg tablet; Take two tablets by mouth daily for 1 day, THEN one tablet daily for 4 days.  -     predniSONE  (DELTASONE ) 20 mg tablet; Take two tablets by mouth daily with breakfast for 5 days.  -     benzonatate  (TESSALON ) 200 mg capsule; Take one capsule by mouth every 8 hours.  -     albuterol  sulfate (PROAIR  HFA) 90 mcg/actuation HFA aerosol inhaler; Inhale two puffs by mouth into the lungs every 6 hours as needed.                           Patient Instructions   Take Z pack as directed.  Take 40 mg prednisone  with food daily for 5 days.  Take 200 mg benzonatate  every 8 hours as needed for cough.  Use 2 puffs albuterol  every 4 to 6 hours as needed for persistent cough.  Follow up with your doctor if your symptoms do not improve.

## 2024-07-29 ENCOUNTER — Encounter: Admit: 2024-07-29 | Discharge: 2024-07-29 | Payer: PRIVATE HEALTH INSURANCE

## 2024-07-29 ENCOUNTER — Ambulatory Visit: Admit: 2024-07-29 | Discharge: 2024-07-30 | Payer: PRIVATE HEALTH INSURANCE

## 2024-07-29 VITALS — BP 126/80 | HR 82 | Temp 98.20000°F | Resp 18 | Ht 72.0 in | Wt 211.0 lb

## 2024-07-29 DIAGNOSIS — J019 Acute sinusitis, unspecified: Principal | ICD-10-CM

## 2024-07-29 MED ORDER — AMOXICILLIN-POT CLAVULANATE 875-125 MG PO TAB
1 | ORAL_TABLET | Freq: Two times a day (BID) | ORAL | 0 refills | 7.00000 days | Status: AC
Start: 2024-07-29 — End: ?

## 2024-07-29 NOTE — Patient Instructions
Take Augmentin with food as directed.  Follow up with your doctor if your symptoms do not improve.

## 2024-07-29 NOTE — Progress Notes
 Date of Service: 07/29/2024    Frank Parrish is a 33 y.o. male.  DOB: August 10, 1991  MRN: [id number]     Subjective:             History of Present Illness  Chief Complaint   Patient presents with    Sinus Infection     Dx of sinus infection last Saturday. Pt states that he still has two more days of his antibiotic with no improvement of symptoms.    Patient reports he was treated at [facility] 5 days ago for a sinus infection with doxycycline.  He said he got better, but now the sinus pressure has returned.  He said the pain is above and below his right eye.  He denies fever.         Review of Systems   HENT:  Positive for sinus pressure and sinus pain.          Objective:          albuterol  sulfate (PROAIR  HFA) 90 mcg/actuation HFA aerosol inhaler Inhale two puffs by mouth into the lungs every 6 hours as needed.    amoxicillin -potassium clavulanate (AUGMENTIN ) 875/125 mg tablet Take one tablet by mouth twice daily with meals for 7 days.    amphetamine -dextroamphetamine  XR (ADDERALL XR) 30 mg capsule Take one capsule by mouth every morning. Indications: attention deficit disorder with hyperactivity    benzonatate  (TESSALON ) 200 mg capsule Take one capsule by mouth every 8 hours.    cetirizine (ZYRTEC) 10 mg tablet Take one tablet by mouth daily.    cloNIDine  HCL (CATAPRES ) 0.3 mg tablet Take one tablet by mouth daily.    dextroamphetamine -amphetamine  (ADDERALL) 10 mg tablet Take one tablet by mouth twice daily. Indications: attention deficit disorder with hyperactivity    doxycycline hyclate (VIBRACIN) 100 mg tablet Take one tablet by mouth twice daily.    gabapentin (NEURONTIN) 300 mg capsule TAKE 2 TO 3 CAPSULES BY MOUTH EVERY DAY AT BEDTIME    omeprazole  DR (PRILOSEC) 40 mg capsule Take one capsule by mouth daily before breakfast.    tirzepatide  (weight loss) (ZEPBOUND ) 15 mg/0.5 mL injection PEN Inject 0.5 mL under the skin every 7 days. (Patient not taking: Reported on 07/29/2024)    venlafaxine  XR (EFFEXOR  XR) 75 mg capsule Take one capsule by mouth daily. Indications: repeated episodes of anxiety     Vitals:    07/29/24 1854   BP: 126/80   BP Source: Arm, Right Upper   Pulse: 82   Temp: 98.2 ?F (36.8 ?C)   Resp: 18   SpO2: 100%   TempSrc: Temporal   PainSc: Six   Weight: 95.7 kg (211 lb)   Height: 182.9 cm (6')     Body mass index is 28.62 kg/m?[name]     Physical Exam  Constitutional:       General: He is awake. He is not in acute distress.     Appearance: He is not ill-appearing, toxic-appearing or diaphoretic.   HENT:      Head: Normocephalic.      Right Ear: Hearing, tympanic membrane, ear canal and external ear normal.      Left Ear: Hearing, tympanic membrane, ear canal and external ear normal.      Nose:      Right Sinus: Maxillary sinus tenderness and frontal sinus tenderness present.      Left Sinus: No maxillary sinus tenderness or frontal sinus tenderness.      Mouth/Throat:  Mouth: Mucous membranes are moist.      Pharynx: No pharyngeal swelling, oropharyngeal exudate or posterior oropharyngeal erythema.   Cardiovascular:      Rate and Rhythm: Normal rate and regular rhythm.      Heart sounds: Normal heart sounds.   Pulmonary:      Effort: Pulmonary effort is normal.      Breath sounds: Normal breath sounds and air entry. No decreased breath sounds, wheezing, rhonchi or rales.   Lymphadenopathy:      Head:      Right side of head: No tonsillar, preauricular, posterior auricular or occipital adenopathy.      Left side of head: No tonsillar, preauricular, posterior auricular or occipital adenopathy.      Cervical: No cervical adenopathy.      Upper Body:      Right upper body: No supraclavicular adenopathy.      Left upper body: No supraclavicular adenopathy.   Neurological:      Mental Status: He is alert and oriented to person, place, and time.   Psychiatric:         Mood and Affect: Mood normal.         Behavior: Behavior normal. Behavior is cooperative.         Thought Content: Thought content normal.         Judgment: Judgment normal.              Assessment and Plan:  Frank Parrish was seen today for sinus infection.    Diagnoses and all orders for this visit:    Acute bacterial sinusitis    Other orders  -     amoxicillin -potassium clavulanate (AUGMENTIN ) 875/125 mg tablet; Take one tablet by mouth twice daily with meals for 7 days.                           Patient Instructions   Take Augmentin  with food as directed.  Follow up with your doctor if your symptoms do not improve.

## 2024-08-03 ENCOUNTER — Encounter: Admit: 2024-08-03 | Discharge: 2024-08-03 | Payer: PRIVATE HEALTH INSURANCE

## 2024-08-04 ENCOUNTER — Encounter: Admit: 2024-08-04 | Discharge: 2024-08-04 | Payer: PRIVATE HEALTH INSURANCE

## 2024-08-04 DIAGNOSIS — J0191 Acute recurrent sinusitis, unspecified: Principal | ICD-10-CM

## 2024-08-05 ENCOUNTER — Encounter: Admit: 2024-08-05 | Discharge: 2024-08-05 | Payer: PRIVATE HEALTH INSURANCE

## 2024-08-05 ENCOUNTER — Ambulatory Visit: Admit: 2024-08-05 | Discharge: 2024-08-06 | Payer: PRIVATE HEALTH INSURANCE

## 2024-08-05 DIAGNOSIS — J329 Chronic sinusitis, unspecified: Principal | ICD-10-CM

## 2024-08-05 MED ORDER — PREDNISONE 20 MG PO TAB
20 mg | ORAL_TABLET | Freq: Every day | ORAL | 0 refills | 30.00000 days | Status: AC
Start: 2024-08-05 — End: ?
  Filled 2024-10-14: qty 40, 16d supply, fill #0

## 2024-08-05 MED ORDER — AMOXICILLIN-POT CLAVULANATE 875-125 MG PO TAB
1 | ORAL_TABLET | Freq: Two times a day (BID) | ORAL | 0 refills | 7.00000 days | Status: AC
Start: 2024-08-05 — End: ?

## 2024-08-05 NOTE — Patient Instructions
 It was nice to see you today! Thank you for coming into our clinic.    Please notify the clinic or go to the emergency room if you develop sudden worsening of symptoms, fevers, chest pain, difficulty breathing, or other concerning symptoms.     Please call my nurse if you have any further questions or concerns, at [phone]. You can leave a voicemail if needed. You can also reach us  by sending a secure message through MyChart.    Helpful Information:  We will contact you by MyChart or phone with any test results when they are available.   If you are expecting results and have not heard from us  within 2 weeks of your testing, please send a MyChart message or call the clinic.  Please use the MyChart Refill request or contact your pharmacy directly to request medication refills. - Please allow at least 72 hours for refill requests.  For appointments: Our Scheduling phone number is [phone].   If you were expecting a phone call to schedule a referral and haven't heard from the referring office within two weeks, please let us  know.    Take care,  Ziyah Cordoba, DNP, APRN, FNP-BC      Future Appointments   Date Time Provider Department Center   08/11/2024  7:00 AM [facility] Radiology   08/19/2024 10:45 AM Juanito, Lonni MATSU, MD [facility] ENT   09/01/2024  3:00 PM Valjean Frederick, RD [facility] IM

## 2024-08-05 NOTE — Progress Notes
 Obtained patient's verbal consent to treat them and their agreement to [facility] financial policy and NPP via this telehealth visit during the [facility] Emergency.    Date of Service: 08/05/2024    Frank Parrish is a 33 y.o. male. DOB: 02/06/1991   MRN#: [id number]    Subjective:   Chief Complaint   Patient presents with    Medication Follow-up     Discuss extending abx or what would be the best           Frank Parrish is 33 y.o. patient who presents for telehealth visit.   Patient Reported Other  What topic(s) would you like to cover during your appointment?:  Sinus pressure  Please describe the issue(s) and history with the issue (location, severity, duration, symptoms, etc.).:  Sinus pressure that has lasted 3 weeks and has only slightly improved with two courses of antibiotics 1x doxy and 1x amox 2xday.  What has been done so far to take care of the issue(s)?:  See above plus sinus rinses 2x a day  What are your goals for this visit?:  Figure out what can be done to bridge the gap between now and when I am seen by ENT on October 16    Patient presents today for follow-up on acute on chronic sinusitis.  He has had several sinus infections this year alone.  His current sinus infections started in early September.  He was seen by urgent care and started on azithromycin  and prednisone .  His symptoms persisted, so he was seen by outside urgent care and started on a 7-day course of doxycycline.  Still, his symptoms persisted, so he was seen again by Urgent Care on 9/25 and started on a 7-day course of Augmentin .  He does feel the Augmentin  has helped; however, he is still experiencing significant right facial pressure.  It is located to the entire right side of his face.  He denies fever.  His headache, ear pressure, and sore throat have improved.  Tonight will be his last night of Augmentin .  He has been taking Tylenol as needed and doing saline sinus rinses 1-2 times daily. He is not formally training for a race, but is still running routinely.     BP Readings from Last 5 Encounters:   07/29/24 126/80   07/14/24 123/87   03/09/24 96/75   01/26/24 110/82   01/02/24 (!) 127/92     Wt Readings from Last 5 Encounters:   07/29/24 95.7 kg (211 lb)   07/14/24 93.9 kg (207 lb)   03/09/24 92 kg (202 lb 12.8 oz)   01/26/24 89.3 kg (196 lb 12.8 oz)   01/02/24 95.1 kg (209 lb 9.6 oz)       Past Medical History:    ADHD    Allergy    Anxiety disorder    Obstructive sleep apnea    Seasonal allergic reaction       Surgical History:   Procedure Laterality Date    ANKLE SURGERY Left     2018    HX EAR TUBES  1996    HX TONSILLECTOMY  2012    SPINE SURGERY  2013    microdiscectomy       family history includes Heart valvular in his mother; Hypertension in his father; Mental Illness in his father, mother, paternal aunt, paternal uncle, and sister.    Social History     Socioeconomic History    Marital status: Married  Tobacco Use    Smoking status: Never    Smokeless tobacco: Never   Substance and Sexual Activity    Alcohol use: Never    Drug use: Never    Sexual activity: Yes     Partners: Female     Birth control/protection: Pill, I.U.D.       Social History     Tobacco Use    Smoking status: Never    Smokeless tobacco: Never   Substance Use Topics    Alcohol use: Never        ROS  See HPI   Objective:      albuterol  sulfate (PROAIR  HFA) 90 mcg/actuation HFA aerosol inhaler Inhale two puffs by mouth into the lungs every 6 hours as needed.    amoxicillin -potassium clavulanate (AUGMENTIN ) 875/125 mg tablet Take one tablet by mouth twice daily with meals for 7 days.    amphetamine -dextroamphetamine  XR (ADDERALL XR) 30 mg capsule Take one capsule by mouth every morning. Indications: attention deficit disorder with hyperactivity    cetirizine (ZYRTEC) 10 mg tablet Take one tablet by mouth daily.    cloNIDine  HCL (CATAPRES ) 0.3 mg tablet Take one tablet by mouth daily.    dextroamphetamine -amphetamine  (ADDERALL) 10 mg tablet Take one tablet by mouth twice daily. Indications: attention deficit disorder with hyperactivity    gabapentin (NEURONTIN) 300 mg capsule TAKE 2 TO 3 CAPSULES BY MOUTH EVERY DAY AT BEDTIME    omeprazole  DR (PRILOSEC) 40 mg capsule Take one capsule by mouth daily before breakfast.    predniSONE  (DELTASONE ) 20 mg tablet Take one tablet by mouth daily. Please take in the morning with food.    venlafaxine  XR (EFFEXOR  XR) 75 mg capsule Take one capsule by mouth daily. Indications: repeated episodes of anxiety     There were no vitals filed for this visit.  There is no height or weight on file to calculate BMI.     Physical Exam  Constitutional:       General: He is not in acute distress.     Appearance: Normal appearance. He is not ill-appearing.   HENT:      Head: Normocephalic and atraumatic.   Pulmonary:      Effort: Pulmonary effort is normal.   Musculoskeletal:      Cervical back: Normal range of motion.   Neurological:      Mental Status: He is alert.   Psychiatric:         Mood and Affect: Mood normal.         Behavior: Behavior normal.         Thought Content: Thought content normal.         Greater than 30 minutes spent during this patient encounter with >50% of the time counseling and coordinating care. Consultation time included patient assessment, obtaining patient history, discussion of medical issues, discussing available data and test results, and educating patient regarding conditions and treatment options as well as potential risks and benefits. Additional time was spent on documentation, reviewing patient history and prior office notes, interpreting data and test results, and ordering medications, tests, and procedures as appropriate.          Assessment and Plan:    Recurrent sinusitis (Primary)  - Patient presents today for acute on chronic right sided sinusitis; notes pressure to both the right maxillary and facial sinuses  - Augmentin  has been helpful; will extend course an additional 7 days   - amoxicillin -potassium clavulanate (AUGMENTIN ) 875/125 mg tablet; Take one tablet  by mouth twice daily with meals for 7 days.  Dispense: 14 tablet; Refill: 0  - Prednisone  was also helpful; will re-treat for an additional 5 days, do not take with NSAIDs  - predniSONE  (DELTASONE ) 20 mg tablet; Take one tablet by mouth daily. Please take in the morning with food.  Dispense: 5 tablet; Refill: 0  - Continue with symptomatic management and saline sinus rinses  - CT maxillofacial scheduled for 10/8  - Follows with ENT, Dr. Juanito, on 10/16  - Return precautions given     Orders Placed This Encounter    amoxicillin -potassium clavulanate (AUGMENTIN ) 875/125 mg tablet    predniSONE  (DELTASONE ) 20 mg tablet     Future Appointments   Date Time Provider Department Center   08/11/2024  7:00 AM [facility] Radiology   08/19/2024 10:45 AM Juanito Lonni MATSU, MD [facility]   09/01/2024  3:00 PM Valjean Frederick, RD [facility] IM     Patient Instructions   It was nice to see you today! Thank you for coming into our clinic.    Please notify the clinic or go to the emergency room if you develop sudden worsening of symptoms, fevers, chest pain, difficulty breathing, or other concerning symptoms.     Please call my nurse if you have any further questions or concerns, at [phone]. You can leave a voicemail if needed. You can also reach us  by sending a secure message through MyChart.    Helpful Information:  We will contact you by MyChart or phone with any test results when they are available.   If you are expecting results and have not heard from us  within 2 weeks of your testing, please send a MyChart message or call the clinic.  Please use the MyChart Refill request or contact your pharmacy directly to request medication refills. - Please allow at least 72 hours for refill requests.  For appointments: Our Scheduling phone number is [phone].   If you were expecting a phone call to schedule a referral and haven't heard from the referring office within two weeks, please let us  know.    Take care,  Berthold Glace, DNP, APRN, FNP-BC      Future Appointments   Date Time Provider Department Center   08/11/2024  7:00 AM [facility] Radiology   08/19/2024 10:45 AM Juanito, Lonni MATSU, MD [facility]   09/01/2024  3:00 PM Valjean Frederick, RD [facility] IM        Aleiyah Halpin M Makenzie Weisner, APRN-NP

## 2024-08-11 ENCOUNTER — Encounter: Admit: 2024-08-11 | Discharge: 2024-08-11 | Payer: PRIVATE HEALTH INSURANCE

## 2024-08-11 ENCOUNTER — Ambulatory Visit: Admit: 2024-08-11 | Discharge: 2024-08-11 | Payer: PRIVATE HEALTH INSURANCE

## 2024-08-12 ENCOUNTER — Encounter: Admit: 2024-08-12 | Discharge: 2024-08-12 | Payer: PRIVATE HEALTH INSURANCE

## 2024-08-13 ENCOUNTER — Encounter: Admit: 2024-08-13 | Discharge: 2024-08-13 | Payer: PRIVATE HEALTH INSURANCE

## 2024-08-18 ENCOUNTER — Encounter: Admit: 2024-08-18 | Discharge: 2024-08-18 | Payer: PRIVATE HEALTH INSURANCE

## 2024-08-19 ENCOUNTER — Encounter: Admit: 2024-08-19 | Discharge: 2024-08-19 | Payer: PRIVATE HEALTH INSURANCE

## 2024-08-19 ENCOUNTER — Ambulatory Visit: Admit: 2024-08-19 | Discharge: 2024-08-20 | Payer: PRIVATE HEALTH INSURANCE

## 2024-08-19 VITALS — BP 120/81 | HR 66 | Temp 97.70000°F | Ht 72.0 in | Wt 213.0 lb

## 2024-08-19 DIAGNOSIS — J320 Chronic maxillary sinusitis: Principal | ICD-10-CM

## 2024-08-19 DIAGNOSIS — J322 Chronic ethmoidal sinusitis: Secondary | ICD-10-CM

## 2024-08-19 DIAGNOSIS — J339 Nasal polyp, unspecified: Secondary | ICD-10-CM

## 2024-08-19 NOTE — Patient Instructions
 General Instructions for Surgery Patients [facility]    Surgery Date:        Follow up Appointment:                                                                   at [address] [place], [place] [address]    Surgeon: Dr. Lonni Krebs, MD    Nurse/Nurse Coordinator: Mliss Peers BSN, RN     Contact Information:  Monday - Friday 8:00 - 4:30  Main Office phone number: [phone] option #1    When to Arrive/Surgery Time:  A Pre-Assessment Nurse will contact you a few days prior to your surgery/procedure to review your medical history and provide any additional instructions.  If you have not received a phone call from a Pre-Assessment Nurse by 3:00 two days prior to your scheduled surgery/procedure please call [employer]: [phone].    What to bring:  Bring a list of all medication you are currently taking.    Bring your ID and insurance/prescription cards.    If you have a living will or a durable power of attorney please bring a copy with you, if needed this can be included in your chart.    Bring containers for your glasses, contacts, or dentures.  Please leave valuable items such as jewelry and money at home.   As a courtesy to other families and visitors, please limit the number of your waiting room visitor to ONE.     Preparing for your surgery or procedure:  Bathe or shower the night before or morning of your procedure.  Dress comfortably.  Remove all jewelry including any body piercings.    Do not wear make-up or fingernail polish.      Eating or drinking before surgery:  Nothing to eat after 12:00pm the night before your surgery including gum, mints, and candy.      Medications:  For your safety, some medications might need to be stopped before your upcoming surgery/procedure.  Please make us  aware of all prescription, over the counter, or supplments you are taking.  The surgical team will let you know whether to take any doses of your prescription medications the morning of your procedure.        Blood Thinners:  The following medicines can thin the blood and should be avoided 7 days before your surgery. Please contact the prescribing physician for recommendation regarding holding these medications prior to surgery.  Warfarin (Coumadin)  Heparin  Lovenox  Plavix  Ticlid  Xarelto  Pradaxa  Persantine  Ecotrin  Excedrin  Pamprin  Heart Medications:  Continue all heart medications unless you are told to stop by the team or your Cardiologist.     Aspirin and Acetaminophen (Tylenol)  Do NOT take aspirin for pain.  If you have been told to take aspirin for a heart condition or to prevent/treat stroke, continue taking aspirin and the [facility] team will give you detailed instructions.  It is OKAY to take acetaminophen (Tylenol) prior to your procedure.      Specific medication instructions for 14 days prior to your surgery/procedure:  14 days prior to your procedure, STOP taking most vitamins, herbals, and supplements.    The following medication are OKAY TO CONTINUE:  Calcium  Folic Acid  Iron  Magnesium  Potassium  Probiotic  Vitamin B  Vitamin D  Zinc    7 days prior to your procedure, STOP taking the following weekly diabetic or weight loss medications:  (Saxenda) Liraglutide  (Trulicity) Dulaflutide  (Ozempic) Semaglutide  [place]) Semaglutide    Going Home:  You must have someone available to drive you home after your surgery.    Notify your Physician if:  You become ill the day before your procedure with fever, cold symptoms, sore throat, nausea, vomiting,  or other illness symptoms.    [facility] Address and Instructions:  [address]  [place], [place] [address]

## 2024-08-19 NOTE — Progress Notes
 Date of Service: 08/19/2024    Subjective:             Frank Parrish is a 33 y.o. male.    History of Present Illness     Frank Parrish was referred by [name], APRN-NP, for sinus evaluation after CT showed [facility] pattern obstruction on the right and polyposis on the left in the ethmoid cavity. He has a 26 month old son in daycare and his wife is a Location manager at [employer]. Every time he gets a URI in the past year it turns into a sinus infection. This happens despite saline nasal irrigations, flonase and astelin (which he continues to use regularly). Abx help but symptoms return. He last completed a course of abx about 4 days ago and feels relatively baseline.    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    Past Medical History:    ADHD    Allergy    Anxiety disorder    Obstructive sleep apnea    Seasonal allergic reaction     Surgical History:   Procedure Laterality Date    ANKLE SURGERY Left     2018    HX EAR TUBES  1996    HX TONSILLECTOMY  2012    SPINE SURGERY  2013    microdiscectomy     Family History   Problem Relation Name Age of Onset    Mental Illness Mother Fernandez Kenley         ADHD, Anxiety    Heart valvular Mother Shanna Strength         bicuspid aortic valve    Hypertension Father Dereon Corkery     Mental Illness Father Fitzhugh Vizcarrondo         ADHD, Anxiety    Mental Illness Sister Rosina Mana         ADHD, Anxiety, Depression    Mental Illness Paternal Aunt Zebedee Raspberry         ADHD    Mental Illness Paternal Uncle Marinda Raspberry         ADHD     Tobacco Use History[1]  Social History     Substance and Sexual Activity   Drug Use Never       PMH, SH, FH, allergies and medications above have been reviewed.               Objective:         albuterol  sulfate (PROAIR  HFA) 90 mcg/actuation HFA aerosol inhaler Inhale two puffs by mouth into the lungs every 6 hours as needed.    amphetamine -dextroamphetamine  XR (ADDERALL XR) 30 mg capsule Take one capsule by mouth every morning. Indications: attention deficit disorder with hyperactivity    cetirizine (ZYRTEC) 10 mg tablet Take one tablet by mouth daily.    cloNIDine  HCL (CATAPRES ) 0.3 mg tablet Take one tablet by mouth daily.    dextroamphetamine -amphetamine  (ADDERALL) 10 mg tablet Take one tablet by mouth twice daily. Indications: attention deficit disorder with hyperactivity    gabapentin (NEURONTIN) 300 mg capsule TAKE 2 TO 3 CAPSULES BY MOUTH EVERY DAY AT BEDTIME    omeprazole  DR (PRILOSEC) 40 mg capsule Take one capsule by mouth daily before breakfast.    predniSONE  (DELTASONE ) 20 mg tablet Take one tablet by mouth daily. Please take in the morning with food.    venlafaxine  XR (EFFEXOR  XR) 75 mg capsule Take one capsule by mouth daily. Indications: repeated episodes of anxiety     Vitals:  08/19/24 1046   BP: 120/81   Pulse: 66   Temp: 36.5 ?C (97.7 ?F)   PainSc: Zero   Weight: 96.6 kg (213 lb)   Height: 182.9 cm (6')     Body mass index is 28.89 kg/m?[name]     Physical Exam  General: Well-developed, well-nourished   Communication and Voice: Clear pitch and clarity   Hearing: Hearing adequate for verbal communication bilaterally   Inspection: Normocephalic and atraumatic without mass or lesion   Palpation: Facial skeleton intact without bony stepoffs.    Facial Strength: Facial motility symmetric and full bilaterally   Pinna: External ear intact and fully developed   External canal: Canal is patent with intact skin   Tympanic Membrane: Normal bilaterally   External nose: No scar or anatomic deformity   Internal Nose: Septum intact.  MMM.  Turbinates 2+  Oral cavity, Lips, Teeth, and Gums: Mucosa and teeth intact and viable, No lesions, masses or ulcers   Oropharynx: No erythema or exudate, no masses or ulcerations, no asymmetry   Larynx: Normal voice, no stridor or stertor.   Neck, Trachea, Lymphatics: Midline trachea without mass or lesion, no lymphadenopathy   Thyroid: No mass or nodularity   Eyes: No nystagmus with equal extraocular motion bilaterally   Neuro/Psych/Balance: Patient oriented and appropriate in interaction; Appropriate mood and affect; Gait is intact with no imbalance; Cranial nerves I-XII are intact   Respiratory effort: Equal inspiration and expiration, no respiratory distress   Peripheral Vascular: Warm extremities with equal distal pulses    Procedure:    PreOp Dx:  Chronic rhinosinusitis  PostOP Dx:  Same  Procedure:  Rigid Nasal Endoscopy  Surgeon:  Lonni Krebs, MD    Indication:  CRS with [facility] obstruction on the right on CT    Description of Procedure:  After obtaining verbal informed consent, I sprayed neosynephrine and 4% lidocaine in the bilateral nasal cavity.  After adequate time for decongestion and anesthesia a rigid 2.7 mm 30 degree nasal endoscope was inserted into the right and left nasal cavity which showed no bleeding or mass.  The infundibular area (middle meatus) on the right showed polyps and the posterior sphenoethmoidal recess (and superior meatus) showed polyps.  The left side was polyps and clear, respectively.  The septum was intact, midline.  The turbinates were hypertrophic.  The scope was then withdrawn and the patient tolerated the procedure well.    EBL:  Minimal  Complication: None  Anesthesia: Topical neosynephrine and 4% lidocaine  Disposition: Stable to home    Imaging, reviewed and agree:  EXAM: CT MAXILLOFACIAL     HISTORY: Acute recurrent sinusitis     TECHNIQUE:  Multiple contiguous axial images were obtained through the   maxillofacial region with coronal reformatted images.     COMPARISON: None     FINDINGS:      Near completely opacified right maxillary sinus with at least moderate   right ethmoid and frontal sinus mucosal thickening. Additional mild   scattered paranasal sinus mucosal thickening throughout the remainder of   the left frontal, ethmoid, bilateral sphenoid and left maxillary sinuses.   Superimposed small left anterior ethmoid osteoma. No mastoid or middle ear effusion.     The olfactory fossa demonstrate Keros Type II morphology. The right   cribriform plate is slightly inferior to the left by approximately 1 mm.   The lamina papyracea are intact. No evidence of supraorbital   pneumatization above the anterior ethmoid artery canals. Sellar  pneumatization of the sphenoid.     No areas of osseous destruction or bony dehiscence are identified. Mild   polypoid mucosal thickening within the posterior nasal cavity. The nasal   septum is mildly deviated leftward with small left septal spur.  The   dentition is unremarkable.     The visualized intracranial structures, globes, and orbits are grossly   unremarkable.       IMPRESSION       1. Predominant right ostiomeatal unit pattern of inflammatory sinus   disease with additional mild scattered paranasal sinus mucosal thickening.   2.  Polypoid mucosal thickening posteriorly within the nasal cavity.   3.  Small left anterior ethmoid osteoma.        Finalized by Burnard July, D.O. on 08/11/2024 11:21 AM. Dictated by   Burnard July, D.O. on 08/11/2024 11:12 AM.      Assessment and Plan:  1. Chronic maxillary sinusitis        2. Chronic ethmoidal sinusitis        3. Nasal polyposis          The patient has clearly failed maximal medical management for chronic rhinosinusiits and would be an excellent candidate for endoscopic sinus surgery to open all indicated/obstructed sinuses, and possibly reduce the turbinates in an appropriate fashion.  We discussed risks, benefits and alternatives to surgery and verbal informed consent was obtained.  Written documentation will follow the day of surgery.  The patient understands the risks of surgery to include, but not limited to, epiphora, bleeding (requiring return to the OR or nasal packing), plan for 2-3 post op office procedures (nasal endoscopy with debridement), nasal dryness/crusting, decreased sense of smell, polyp recurrence or scarring (future need for revision sinus surgery), persistent drainage and headaches, CSF rhinorrhea/meningits, plan for serial post operative debridement/pain, double vision and blindness.  They will contact my office should they elect to proceed.    Thanks for allowing me to participate in the care of this pleasant patient.                               [1]   Social History  Tobacco Use   Smoking Status Never   Smokeless Tobacco Never

## 2024-09-01 ENCOUNTER — Encounter: Admit: 2024-09-01 | Discharge: 2024-09-01 | Payer: PRIVATE HEALTH INSURANCE

## 2024-09-07 ENCOUNTER — Encounter: Admit: 2024-09-07 | Discharge: 2024-09-07 | Payer: PRIVATE HEALTH INSURANCE

## 2024-09-21 ENCOUNTER — Encounter: Admit: 2024-09-21 | Discharge: 2024-09-21 | Payer: PRIVATE HEALTH INSURANCE

## 2024-09-22 ENCOUNTER — Ambulatory Visit: Admit: 2024-09-22 | Discharge: 2024-09-22 | Payer: PRIVATE HEALTH INSURANCE

## 2024-09-22 ENCOUNTER — Encounter: Admit: 2024-09-22 | Discharge: 2024-09-22 | Payer: PRIVATE HEALTH INSURANCE

## 2024-09-24 ENCOUNTER — Encounter: Admit: 2024-09-24 | Discharge: 2024-09-24 | Payer: PRIVATE HEALTH INSURANCE

## 2024-09-24 DIAGNOSIS — K219 Gastro-esophageal reflux disease without esophagitis: Principal | ICD-10-CM

## 2024-09-24 MED ORDER — OMEPRAZOLE 40 MG PO CPDR
40 mg | ORAL_CAPSULE | Freq: Every day | ORAL | 1 refills | 90.00000 days | Status: AC
Start: 2024-09-24 — End: ?
  Filled 2024-09-25: qty 90, 90d supply, fill #0

## 2024-09-24 NOTE — Telephone Encounter [36]
 1. Received e-request from pharmacy requesting new Rx for   Requested Prescriptions     Pending Prescriptions Disp Refills    omeprazole  DR (PRILOSEC) 40 mg capsule 90 capsule 1     Sig: Take one capsule by mouth daily before breakfast.   .    2. Last Rx written: 11/25/23  (#90 x1) by NP    3. Last Gen Med Visit: telehealth on 08/05/24    4. Next Appt due: Provider recommended return date from last office visit: n/a    No future appointments.    5. All protocol criteria met? Yes, last office visit assessment and plan reviewed.     Reason Protocol Failed: N/A    Courtesy Refills: N/A    Patient due for an office visit in 1,2, or 3 months 90 day supply, 0 refills   Patient due for an office visit in 4,5, or 6 months 90 day supply, 1 refill   Patient due for an office visit in 7,8, or 9 months 90 day supply, 2 refills   Patient due for an office visit in 10,11, or 12 months 90 day supply, 3 refills

## 2024-10-11 ENCOUNTER — Encounter: Admit: 2024-10-11 | Discharge: 2024-10-11 | Payer: PRIVATE HEALTH INSURANCE

## 2024-10-13 ENCOUNTER — Ambulatory Visit: Admit: 2024-10-13 | Discharge: 2024-10-13 | Payer: PRIVATE HEALTH INSURANCE

## 2024-10-14 ENCOUNTER — Encounter: Admit: 2024-10-14 | Discharge: 2024-10-14 | Payer: PRIVATE HEALTH INSURANCE

## 2024-10-14 ENCOUNTER — Ambulatory Visit: Admit: 2024-10-14 | Discharge: 2024-10-14 | Payer: PRIVATE HEALTH INSURANCE

## 2024-10-14 MED ORDER — ARTIFICIAL TEARS (PF) SINGLE DOSE DROPS GROUP
OPHTHALMIC | 0 refills | Status: DC
Start: 2024-10-14 — End: 2024-10-14

## 2024-10-14 MED ORDER — ACETAMINOPHEN 1,000 MG/100 ML (10 MG/ML) IV SOLN
INTRAVENOUS | 0 refills | Status: DC
Start: 2024-10-14 — End: 2024-10-14

## 2024-10-14 MED ORDER — DEXMEDETOMIDINE IN 0.9 % NACL 20 MCG/5 ML (4 MCG/ML) IV SYRG
INTRAVENOUS | 0 refills | Status: DC
Start: 2024-10-14 — End: 2024-10-14

## 2024-10-14 MED ORDER — PHENYLEPHRINE HCL IN 0.9% NACL 1 MG/10 ML (100 MCG/ML) IV SYRG
INTRAVENOUS | 0 refills | Status: DC
Start: 2024-10-14 — End: 2024-10-14

## 2024-10-14 MED ORDER — ONDANSETRON HCL (PF) 4 MG/2 ML IJ SOLN
INTRAVENOUS | 0 refills | Status: DC
Start: 2024-10-14 — End: 2024-10-14

## 2024-10-14 MED ORDER — LIDOCAINE (PF) 20 MG/ML (2 %) IJ SOLN
INTRAVENOUS | 0 refills | Status: DC
Start: 2024-10-14 — End: 2024-10-14

## 2024-10-14 MED ORDER — SUGAMMADEX 100 MG/ML IV SOLN
INTRAVENOUS | 0 refills | Status: DC
Start: 2024-10-14 — End: 2024-10-14

## 2024-10-14 MED ORDER — DEXAMETHASONE SODIUM PHOSPHATE 4 MG/ML IJ SOLN
INTRAVENOUS | 0 refills | Status: DC
Start: 2024-10-14 — End: 2024-10-14

## 2024-10-14 MED ORDER — ROCURONIUM 10 MG/ML IV SOLN
INTRAVENOUS | 0 refills | Status: DC
Start: 2024-10-14 — End: 2024-10-14

## 2024-10-14 MED ORDER — PROPOFOL INJ 10 MG/ML IV VIAL
INTRAVENOUS | 0 refills | Status: DC
Start: 2024-10-14 — End: 2024-10-14

## 2024-10-14 MED ORDER — CEFAZOLIN 1 GRAM IJ SOLR
INTRAVENOUS | 0 refills | Status: DC
Start: 2024-10-14 — End: 2024-10-14

## 2024-10-14 MED ORDER — MIDAZOLAM 1 MG/ML IJ SOLN
INTRAVENOUS | 0 refills | Status: DC
Start: 2024-10-14 — End: 2024-10-14

## 2024-10-14 MED ORDER — FENTANYL CITRATE (PF) 50 MCG/ML IJ SOLN
INTRAVENOUS | 0 refills | Status: DC
Start: 2024-10-14 — End: 2024-10-14

## 2024-10-14 MED FILL — ONDANSETRON 4 MG PO TBDI: 4 mg | ORAL | 2 days supply | Qty: 5 | Fill #0 | Status: CP

## 2024-10-14 MED FILL — TRAMADOL 50 MG PO TAB: 50 mg | ORAL | 5 days supply | Qty: 15 | Fill #0 | Status: CP

## 2024-10-14 MED FILL — IBUPROFEN 600 MG PO TAB: 600 mg | ORAL | 10 days supply | Qty: 40 | Fill #0 | Status: CP

## 2024-10-14 MED FILL — DOXYCYCLINE HYCLATE 100 MG PO CAP: 100 mg | ORAL | 10 days supply | Qty: 20 | Fill #0 | Status: CP

## 2024-10-15 ENCOUNTER — Encounter: Admit: 2024-10-15 | Discharge: 2024-10-15 | Payer: PRIVATE HEALTH INSURANCE

## 2024-10-19 NOTE — Patient Instructions [37]
 Mliss Peers, BSN, RN   Ambulatory Clinic RN Care Coordinator, Dr Lonni Krebs  Department of Otolaryngology-Head and Neck Surgery  [facility]  Phone [phone] - Fax [phone] - [email or web address]  [address] [place], [place] [address]

## 2024-10-21 ENCOUNTER — Encounter: Admit: 2024-10-21 | Discharge: 2024-10-21 | Payer: PRIVATE HEALTH INSURANCE

## 2024-10-21 ENCOUNTER — Ambulatory Visit: Admit: 2024-10-21 | Discharge: 2024-10-22 | Payer: PRIVATE HEALTH INSURANCE

## 2024-10-21 VITALS — BP 137/91 | HR 71 | Ht 72.0 in | Wt 221.2 lb

## 2024-10-21 DIAGNOSIS — Z9889 Other specified postprocedural states: Secondary | ICD-10-CM

## 2024-10-21 DIAGNOSIS — J320 Chronic maxillary sinusitis: Secondary | ICD-10-CM

## 2024-10-21 DIAGNOSIS — J339 Nasal polyp, unspecified: Secondary | ICD-10-CM

## 2024-10-21 DIAGNOSIS — J322 Chronic ethmoidal sinusitis: Principal | ICD-10-CM

## 2024-10-21 NOTE — Progress Notes [1]
 Date of Service: 10/21/2024    Subjective:             Frank Parrish Frank Parrish is a 33 y.o. male.    History of Present Illness    Frank Parrish had FESS for polyps on 10/14/2024. He has had minimal bleeding or pain. He is traveling by car to [place]  for Christmas with is wife's family.             Objective:        Objective    acetaminophen  (ACETAMINOPHEN  EXTRA STRENGTH) 500 mg tablet Take one tablet by mouth every 4 hours as needed for up to 10 days. Max of 4,000 mg of acetaminophen  in 24 hours.    amphetamine -dextroamphetamine  XR (ADDERALL XR) 30 mg capsule Take one capsule by mouth every morning. Indications: attention deficit disorder with hyperactivity    cetirizine (ZYRTEC) 10 mg tablet Take one tablet by mouth daily.    cloNIDine  HCL (CATAPRES ) 0.3 mg tablet Take one tablet by mouth daily.    dextroamphetamine -amphetamine  (ADDERALL) 10 mg tablet Take one tablet by mouth twice daily. Indications: attention deficit disorder with hyperactivity    doxycycline  hyclate (VIBRAMYCIN ) 100 mg capsule Take one capsule by mouth twice daily for 10 days.    gabapentin (NEURONTIN) 300 mg capsule TAKE 2 TO 3 CAPSULES BY MOUTH EVERY DAY AT BEDTIME    ibuprofen  (IBU) 600 mg tablet Take one tablet by mouth every 6 hours as needed for Pain for up to 10 days. Take with food.    omeprazole  DR (PRILOSEC) 40 mg capsule Take one capsule by mouth daily before breakfast.    predniSONE  (DELTASONE ) 10 mg tablet Take four tablets by mouth daily with breakfast for 4 days, THEN three tablets daily with breakfast for 4 days, THEN two tablets daily with breakfast for 4 days, THEN one tablet daily with breakfast for 4 days.    sinus rinse pack kit Apply one packet into nose as directed twice daily. Rinse the sinuses per the manufacturer instructions twice a day until instructed to stop by your surgeon.    sodium chloride (SEA MIST) 0.65 % nasal spray Apply two sprays to each nostril as directed every 4 hours for 21 days. Spray both nostrils every 4 hours and more frequently if needed    venlafaxine  XR (EFFEXOR  XR) 75 mg capsule Take one capsule by mouth daily. Indications: repeated episodes of anxiety     Vitals:    10/21/24 0956   BP: (!) 137/91   BP Source: Arm, Left Upper   Pulse: 71   PainSc: Zero   Weight: 100.3 kg (221 lb 3.2 oz)   Height: 182.9 cm (6')     Body mass index is 30 kg/m?[name]   Physical Exam     Voice: normal  Ear: normal pinna  Nasal: moist mucosa  Oral: tongue midline  Neck: supple.    Procedure Note:    PreOp Dx:  Chronic sinusitis  Post OP Dx: Same    Surgeon: Lonni Krebs, MD    Indication for procedure:  The patient had recent endoscopic sinus surgery and has post operative crusting, debris and bone chips that necessitate removal to prevent scarring and fibrosis.    Procedure: Bilateral Nasal Endoscopy with Debridement    Description of Procedure:    After obtaining verbal informed consent, I placed neosynephrine and 4% lidocaine  soaked cottonoid pledgets in the bilateral nasal cavity  After adequate time for decongestion and anesthesia (plus or minus 1% lidocaine  with  1:100,000 epinephrine injection) a rigid 30 degree nasal endoscope was inserted.  A combination of straight and curved suction devices were used to canulate all open and previously dissected sinus regions.  Postsurgical crusting and contaminated tissue was removed.  A freer elevator and forceps were used to medialize the middle turbinate and removed exposed bone/debris, bilaterally.  All absorbable packing was removed.  Minimal bleeding was controlled with topical neosynephrine spray.    Findings:  Nasopore packing, thick mucous, bone fragments, etc removed with straight and curved suction bilaterally. Widely patent ethmoid, maxillary and sphenoid sinus ostia.    EBL: Minimal    Complications: None       Assessment and Plan:  1. Chronic ethmoidal sinusitis        2. Chronic maxillary sinusitis        3. Nasal polyposis        4. History of endoscopic sinus surgery          BID irrigations x 5 more week.s  F/u 2-3 weeks for repeat endoscopy and possible debridement.  Ok to restart flonase or alternative.

## 2024-11-03 NOTE — Patient Instructions [37]
 Mliss Peers, BSN, RN   Ambulatory Clinic RN Care Coordinator, Dr Lonni Krebs  Department of Otolaryngology-Head and Neck Surgery  [facility]  Phone [phone] - Fax [phone] - [email or web address]  [address] [place], [place] [address]

## 2024-11-11 ENCOUNTER — Encounter: Admit: 2024-11-11 | Discharge: 2024-11-11 | Payer: PRIVATE HEALTH INSURANCE

## 2024-11-11 ENCOUNTER — Ambulatory Visit: Admit: 2024-11-11 | Discharge: 2024-11-12 | Payer: PRIVATE HEALTH INSURANCE

## 2024-11-11 VITALS — BP 122/86 | HR 80 | Ht 72.0 in | Wt 224.0 lb

## 2024-11-11 DIAGNOSIS — Z9889 Other specified postprocedural states: Secondary | ICD-10-CM

## 2024-11-11 DIAGNOSIS — J322 Chronic ethmoidal sinusitis: Principal | ICD-10-CM

## 2024-11-11 DIAGNOSIS — J339 Nasal polyp, unspecified: Secondary | ICD-10-CM

## 2024-11-11 DIAGNOSIS — J320 Chronic maxillary sinusitis: Secondary | ICD-10-CM

## 2024-11-11 NOTE — Progress Notes [1]
 Date of Service: 11/11/2024    Subjective:             Frank Parrish is a 34 y.o. male.    History of Present Illness    Frank Parrish and his family went to [place]  over holidays and is doing well after FESS for CRS with polyps on 10/14/2024. He has not restarted flonase but is irrigating regularly and feeling so much more airflow through the nose.             Objective:        Objective    amphetamine -dextroamphetamine  XR (ADDERALL XR) 30 mg capsule Take one capsule by mouth every morning. Indications: attention deficit disorder with hyperactivity    cetirizine (ZYRTEC) 10 mg tablet Take one tablet by mouth daily.    cloNIDine  HCL (CATAPRES ) 0.3 mg tablet Take one tablet by mouth daily.    dextroamphetamine -amphetamine  (ADDERALL) 10 mg tablet Take one tablet by mouth twice daily. Indications: attention deficit disorder with hyperactivity    gabapentin (NEURONTIN) 300 mg capsule TAKE 2 TO 3 CAPSULES BY MOUTH EVERY DAY AT BEDTIME    omeprazole  DR (PRILOSEC) 40 mg capsule Take one capsule by mouth daily before breakfast.    sinus rinse pack kit Apply one packet into nose as directed twice daily. Rinse the sinuses per the manufacturer instructions twice a day until instructed to stop by your surgeon.    venlafaxine  XR (EFFEXOR  XR) 75 mg capsule Take one capsule by mouth daily. Indications: repeated episodes of anxiety     Vitals:    11/11/24 1332   BP: 122/86   BP Source: Arm, Left Upper   Pulse: 80   PainSc: Zero   Weight: 101.6 kg (224 lb)   Height: 182.9 cm (6')     Body mass index is 30.38 kg/m?[name]   Physical Exam     Voice: normal  Ear: normal pinna  Nasal: moist mucosa  Oral: tongue midline  Neck: supple.    Procedure Note:    PreOp Dx:  Chronic sinusitis  Post OP Dx: Same    Surgeon: Lonni Krebs, MD    Indication for procedure:  The patient had recent endoscopic sinus surgery and has post operative crusting, debris and bone chips that necessitate removal to prevent scarring and fibrosis.    Procedure: Bilateral Nasal Endoscopy with Debridement    Description of Procedure:    After obtaining verbal informed consent, I placed neosynephrine and 4% lidocaine  soaked cottonoid pledgets in the bilateral nasal cavity  After adequate time for decongestion and anesthesia (plus or minus 1% lidocaine  with 1:100,000 epinephrine injection) a rigid 30 degree nasal endoscope was inserted.  A combination of straight and curved suction devices were used to canulate all open and previously dissected sinus regions.  Postsurgical crusting and contaminated tissue was removed.  A freer elevator and forceps were used to medialize the middle turbinate and removed exposed bone/debris, bilaterally.  All absorbable packing was removed.  Minimal bleeding was controlled with topical neosynephrine spray.    Findings:  bone fragment and crusting removed at [facility] opening (no mucous recirculation) bilaterally. Mild polypoid degeneration without overt polyposis or sinus obstruction on either side.    EBL: Minimal    Complications: None       Assessment and Plan:  1. Chronic ethmoidal sinusitis        2. Chronic maxillary sinusitis        3. History of endoscopic sinus surgery  Restart flonase 2 sprays BID.   Proper topical nasal spray application technique was demonstrated in the office to minimize risks of septal perforation and epistaxis.  F/u 4-6 weeks, consider dexamethasone , xhance, or budesonide irrigations if progressive polyposis noted at that time.

## 2024-11-15 ENCOUNTER — Encounter: Admit: 2024-11-15 | Discharge: 2024-11-15 | Payer: PRIVATE HEALTH INSURANCE

## 2024-11-16 ENCOUNTER — Encounter: Admit: 2024-11-16 | Discharge: 2024-11-16 | Payer: PRIVATE HEALTH INSURANCE
